# Patient Record
Sex: Female | Born: 1995 | Race: White | Hispanic: No | Marital: Single | Smoking: Current every day smoker
Health system: Southern US, Community
[De-identification: ages and names within clinical notes are randomized; demographics above are authoritative.]

## PROBLEM LIST (undated history)

## (undated) DIAGNOSIS — I10 Essential (primary) hypertension: Secondary | ICD-10-CM

---
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
## 2015-01-13 DIAGNOSIS — K069 Disorder of gingiva and edentulous alveolar ridge, unspecified: Secondary | ICD-10-CM | POA: Insufficient documentation

## 2015-12-07 DIAGNOSIS — K0889 Other specified disorders of teeth and supporting structures: Secondary | ICD-10-CM | POA: Insufficient documentation

## 2017-01-04 ENCOUNTER — Other Ambulatory Visit: Payer: Self-pay

## 2017-01-04 ENCOUNTER — Emergency Department
Admission: EM | Admit: 2017-01-04 | Discharge: 2017-01-04 | Disposition: A | Discharge status: Home / Self Care | Payer: Self-pay | Attending: Student in an Organized Health Care Education/Training Program | Admitting: Student in an Organized Health Care Education/Training Program

## 2017-01-04 ENCOUNTER — Encounter: Payer: Self-pay | Admitting: Emergency Medicine

## 2017-01-04 DIAGNOSIS — N939 Abnormal uterine and vaginal bleeding, unspecified: Secondary | ICD-10-CM | POA: Insufficient documentation

## 2017-01-04 DIAGNOSIS — F17210 Nicotine dependence, cigarettes, uncomplicated: Secondary | ICD-10-CM | POA: Insufficient documentation

## 2017-01-04 LAB — CBC
HCT: 43.4 % (ref 35.0–47.0)
Hemoglobin: 14.9 g/dL (ref 12.0–16.0)
MCH: 31.6 pg (ref 26.0–34.0)
MCHC: 34.4 g/dL (ref 32.0–36.0)
MCV: 92 fL (ref 80.0–100.0)
PLATELETS: 219 10*3/uL (ref 150–440)
RBC: 4.72 MIL/uL (ref 3.80–5.20)
RDW: 13.1 % (ref 11.5–14.5)
WBC: 11.8 10*3/uL — AB (ref 3.6–11.0)

## 2017-01-04 LAB — URINALYSIS, COMPLETE (UACMP) WITH MICROSCOPIC
BILIRUBIN URINE: NEGATIVE
GLUCOSE, UA: NEGATIVE mg/dL
Ketones, ur: NEGATIVE mg/dL
LEUKOCYTES UA: NEGATIVE
Nitrite: NEGATIVE
PH: 6 (ref 5.0–8.0)
Protein, ur: 30 mg/dL — AB
SPECIFIC GRAVITY, URINE: 1.017 (ref 1.005–1.030)

## 2017-01-04 LAB — POC URINE PREG, ED: Preg Test, Ur: NEGATIVE

## 2017-01-04 LAB — BASIC METABOLIC PANEL
Anion gap: 8 (ref 5–15)
BUN: 7 mg/dL (ref 6–20)
CHLORIDE: 103 mmol/L (ref 101–111)
CO2: 28 mmol/L (ref 22–32)
CREATININE: 0.63 mg/dL (ref 0.44–1.00)
Calcium: 9.2 mg/dL (ref 8.9–10.3)
GFR calc non Af Amer: >60 mL/min (ref >60)
Glucose, Bld: 75 mg/dL (ref 65–99)
POTASSIUM: 3 mmol/L — AB (ref 3.5–5.1)
SODIUM: 139 mmol/L (ref 135–145)

## 2017-01-04 NOTE — ED Provider Notes (Signed)
[facility] Emergency Department Provider Note    First MD Initiated Contact with Patient 01/04/17 2025     (approximate)  I have reviewed the triage vital signs and the nursing notes.   HISTORY  Chief Complaint Vaginal Bleeding    HPI Robin Carroll is a 21 y.o. female with no past history of bleeding disorders or recent hospitalizations presents 2 weeks after her last menstrual cycle with persistent vaginal bleeding.  States that she is gone through roughly 10 pads in the past 24 hours.  States that she has noted a few clots.  Was concerned that she is pregnant.  Denies any abdominal pain at this time.  States that one week ago she was having diffuse cramping sensations but those have significantly improved.  Denies any lightheadedness or fainting spells.  No chest pain or shortness of breath.  Is not on any birth control.  [place]  Is not on any blood thinners.  She does smoke roughly 1 pack of cigarettes per day.  History reviewed. No pertinent past medical history. History reviewed. No pertinent family history. History reviewed. No pertinent surgical history. There are no active problems to display for this patient.     Prior to Admission medications   Not on File    Allergies Penicillins    Social History Social History   Tobacco Use  . Smoking status: Current Every Day Smoker    Packs/day: 0.50    Types: Cigarettes  . Smokeless tobacco: Never Used  Substance Use Topics  . Alcohol use: No    Frequency: Never  . Drug use: No    Review of Systems Patient denies headaches, rhinorrhea, blurry vision, numbness, shortness of breath, chest pain, edema, cough, abdominal pain, nausea, vomiting, diarrhea, dysuria, fevers, rashes or hallucinations unless otherwise stated above in HPI. ____________________________________________   PHYSICAL EXAM:  VITAL SIGNS: Vitals:   01/04/17 1938  BP: (!) 143/72    Pulse: 78  Resp: 16  Temp: 98.8 F (37.1 C)  SpO2: 100%    Constitutional: Alert and oriented. Well appearing and in no acute distress. Eyes: Conjunctivae are normal.  Head: Atraumatic. Nose: No congestion/rhinnorhea. Mouth/Throat: Mucous membranes are moist.   Neck: No stridor. Painless ROM.  Cardiovascular: Normal rate, regular rhythm. Grossly normal heart sounds.  Good peripheral circulation. Respiratory: Normal respiratory effort.  No retractions. Lungs CTAB. Gastrointestinal: Soft and nontender. No distention. No abdominal bruits. No CVA tenderness. Genitourinary: deferred Musculoskeletal: No lower extremity tenderness nor edema.  No joint effusions. Neurologic:  Normal speech and language. No gross focal neurologic deficits are appreciated. No facial droop Skin:  Skin is warm, dry and intact. No rash noted. Psychiatric: Mood and affect are normal. Speech and behavior are normal.  ____________________________________________   LABS (all labs ordered are listed, but only abnormal results are displayed)  Results for orders placed or performed during the hospital encounter of 01/04/17 (from the past 24 hour(s))  POC urine preg, ED     Status: None   Collection Time: 01/04/17  7:51 PM  Result Value Ref Range   Preg Test, Ur Negative Negative  Basic metabolic panel     Status: Abnormal   Collection Time: 01/04/17  7:52 PM  Result Value Ref Range   Sodium 139 135 - 145 mmol/L   Potassium 3.0 (L) 3.5 - 5.1 mmol/L   Chloride 103 101 - 111 mmol/L   CO2 28 22 - 32 mmol/L   Glucose,  Bld 75 65 - 99 mg/dL   BUN 7 6 - 20 mg/dL   Creatinine, Ser 1.610.63 0.44 - 1.00 mg/dL   Calcium 9.2 8.9 - 09.610.3 mg/dL   GFR calc non Af Amer >60 >60 mL/min   GFR calc Af Amer >60 >60 mL/min   Anion gap 8 5 - 15  CBC     Status: Abnormal   Collection Time: 01/04/17  7:52 PM  Result Value Ref Range   WBC 11.8 (H) 3.6 - 11.0 K/uL   RBC 4.72 3.80 - 5.20 MIL/uL   Hemoglobin 14.9 12.0 - 16.0 g/dL    HCT 04.543.4 40.935.0 - 81.147.0 %   MCV 92.0 80.0 - 100.0 fL   MCH 31.6 26.0 - 34.0 pg   MCHC 34.4 32.0 - 36.0 g/dL   RDW 91.413.1 78.211.5 - 95.614.5 %   Platelets 219 150 - 440 K/uL  Urinalysis, Complete w Microscopic     Status: Abnormal   Collection Time: 01/04/17  7:52 PM  Result Value Ref Range   Color, Urine YELLOW (A) YELLOW   APPearance CLEAR (A) CLEAR   Specific Gravity, Urine 1.017 1.005 - 1.030   pH 6.0 5.0 - 8.0   Glucose, UA NEGATIVE NEGATIVE mg/dL   Hgb urine dipstick LARGE (A) NEGATIVE   Bilirubin Urine NEGATIVE NEGATIVE   Ketones, ur NEGATIVE NEGATIVE mg/dL   Protein, ur 30 (A) NEGATIVE mg/dL   Nitrite NEGATIVE NEGATIVE   Leukocytes, UA NEGATIVE NEGATIVE   RBC / HPF TOO NUMEROUS TO COUNT 0 - 5 RBC/hpf   WBC, UA 0-5 0 - 5 WBC/hpf   Bacteria, UA RARE (A) NONE SEEN   Squamous Epithelial / LPF 0-5 (A) NONE SEEN   Mucus PRESENT    Ca Oxalate Crys, UA PRESENT    ____________________________________________ ____________________________________________   PROCEDURES  Procedure(s) performed:  Procedures    Critical Care performed: no ____________________________________________   INITIAL IMPRESSION / ASSESSMENT AND PLAN / ED COURSE  Pertinent labs & imaging results that were available during my care of the patient were reviewed by me and considered in my medical decision making (see chart for details).  DDX: Anovulatory cycle, ectopic, pregnancy, blood dyscrasia, dysfunctional uterine bleeding  Robin CellarBrandy R Schwimmer is a 21 y.o. who presents to the ED with vaginal bleeding as described above.  Patient well-appearing and in no acute distress.  Patient is not pregnant.  Abdominal exam is soft and benign.  Based on her heavy smoking history do not feel that she is appropriate candidate for oral contraceptive pills.  Patient is also at higher risk for TXA.  Have offered TXA as well as ultrasound evaluation the patient but she is otherwise well-appearing.  States that she would prefer to trial a  period of Motrin and follow-up with OB/GYN.  Discussed signs and symptoms for which she should return immediately to the hospital.      ____________________________________________   FINAL CLINICAL IMPRESSION(S) / ED DIAGNOSES  Final diagnoses:  Vaginal bleeding      NEW MEDICATIONS STARTED DURING THIS VISIT:  This SmartLink is deprecated. Use AVSMEDLIST instead to display the medication list for a patient.   Note:  This document was prepared using Dragon voice recognition software and may include unintentional dictation errors.    Willy Eddyobinson, Kelyn Ponciano, MD 01/04/17 2055

## 2017-01-04 NOTE — ED Triage Notes (Signed)
Pt to ED c/o vaginal bleeding x3 days with some generalized abd cramping.  States nausea but no vomiting or diarrhea.  Reports last menstrual cycle 2 weeks ago.  Possibly pregnant.  Reports several pads worth a day.

## 2017-08-03 ENCOUNTER — Encounter: Payer: Self-pay | Admitting: Emergency Medicine

## 2017-08-03 ENCOUNTER — Emergency Department
Admission: EM | Admit: 2017-08-03 | Discharge: 2017-08-03 | Disposition: A | Discharge status: Home / Self Care | Payer: Self-pay | Attending: Emergency Medicine | Admitting: Emergency Medicine

## 2017-08-03 ENCOUNTER — Emergency Department: Payer: Self-pay

## 2017-08-03 ENCOUNTER — Other Ambulatory Visit: Payer: Self-pay

## 2017-08-03 DIAGNOSIS — I10 Essential (primary) hypertension: Secondary | ICD-10-CM | POA: Insufficient documentation

## 2017-08-03 DIAGNOSIS — K2900 Acute gastritis without bleeding: Secondary | ICD-10-CM | POA: Insufficient documentation

## 2017-08-03 DIAGNOSIS — F17210 Nicotine dependence, cigarettes, uncomplicated: Secondary | ICD-10-CM | POA: Insufficient documentation

## 2017-08-03 DIAGNOSIS — R109 Unspecified abdominal pain: Secondary | ICD-10-CM | POA: Insufficient documentation

## 2017-08-03 HISTORY — DX: Essential (primary) hypertension: I10

## 2017-08-03 LAB — COMPREHENSIVE METABOLIC PANEL
ALK PHOS: 60 U/L (ref 38–126)
ALT: 14 U/L (ref 0–44)
AST: 16 U/L (ref 15–41)
Albumin: 4.4 g/dL (ref 3.5–5.0)
Anion gap: 6 (ref 5–15)
BILIRUBIN TOTAL: 0.5 mg/dL (ref 0.3–1.2)
BUN: 7 mg/dL (ref 6–20)
CALCIUM: 8.9 mg/dL (ref 8.9–10.3)
CHLORIDE: 103 mmol/L (ref 98–111)
CO2: 31 mmol/L (ref 22–32)
CREATININE: 0.58 mg/dL (ref 0.44–1.00)
Glucose, Bld: 97 mg/dL (ref 70–99)
Potassium: 3.9 mmol/L (ref 3.5–5.1)
Sodium: 140 mmol/L (ref 135–145)
TOTAL PROTEIN: 7.6 g/dL (ref 6.5–8.1)

## 2017-08-03 LAB — URINALYSIS, COMPLETE (UACMP) WITH MICROSCOPIC
Glucose, UA: NEGATIVE mg/dL
Hgb urine dipstick: NEGATIVE
KETONES UR: 5 mg/dL — AB
LEUKOCYTES UA: NEGATIVE
NITRITE: NEGATIVE
Protein, ur: 30 mg/dL — AB
SPECIFIC GRAVITY, URINE: 1.032 — AB (ref 1.005–1.030)
pH: 5 (ref 5.0–8.0)

## 2017-08-03 LAB — CBC
HCT: 45.3 % (ref 35.0–47.0)
Hemoglobin: 15.6 g/dL (ref 12.0–16.0)
MCH: 31.4 pg (ref 26.0–34.0)
MCHC: 34.4 g/dL (ref 32.0–36.0)
MCV: 91.3 fL (ref 80.0–100.0)
PLATELETS: 247 10*3/uL (ref 150–440)
RBC: 4.96 MIL/uL (ref 3.80–5.20)
RDW: 13.6 % (ref 11.5–14.5)
WBC: 7.8 10*3/uL (ref 3.6–11.0)

## 2017-08-03 LAB — LIPASE, BLOOD: LIPASE: 30 U/L (ref 11–51)

## 2017-08-03 LAB — POCT PREGNANCY, URINE: PREG TEST UR: NEGATIVE

## 2017-08-03 MED ORDER — PANTOPRAZOLE SODIUM 40 MG PO TBEC: 40.0000 mg | DELAYED_RELEASE_TABLET | Freq: Every day | ORAL | 1 refills | Status: DC

## 2017-08-03 MED ORDER — LORAZEPAM 2 MG/ML IJ SOLN
1.0000 mg | Freq: Once | INTRAMUSCULAR | Status: AC
  Administered 2017-08-03: 1 mg via INTRAVENOUS
  Filled 2017-08-03: qty 1

## 2017-08-03 MED ORDER — ONDANSETRON 4 MG PO TBDP
4.0000 mg | ORAL_TABLET | Freq: Once | ORAL | Status: AC | PRN
  Administered 2017-08-03: 4 mg via ORAL
  Filled 2017-08-03: qty 1

## 2017-08-03 MED ORDER — ONDANSETRON 4 MG PO TBDP: 4.0000 mg | ORAL_TABLET | Freq: Three times a day (TID) | ORAL | 0 refills | Status: DC | PRN

## 2017-08-03 MED ORDER — SODIUM CHLORIDE 0.9 % IV SOLN
1000.0000 mL | Freq: Once | INTRAVENOUS | Status: AC
  Administered 2017-08-03: 1000 mL via INTRAVENOUS

## 2017-08-03 MED ORDER — ONDANSETRON HCL 4 MG/2ML IJ SOLN
4.0000 mg | Freq: Once | INTRAMUSCULAR | Status: AC
  Administered 2017-08-03: 4 mg via INTRAVENOUS
  Filled 2017-08-03: qty 2

## 2017-08-03 MED ORDER — MORPHINE SULFATE (PF) 4 MG/ML IV SOLN
4.0000 mg | Freq: Once | INTRAVENOUS | Status: AC
Start: 2017-08-03 — End: 2017-08-03
  Administered 2017-08-03: 4 mg via INTRAVENOUS
  Filled 2017-08-03: qty 1

## 2017-08-03 MED ORDER — HALOPERIDOL LACTATE 5 MG/ML IJ SOLN
2.0000 mg | Freq: Once | INTRAMUSCULAR | Status: AC
  Administered 2017-08-03: 2 mg via INTRAVENOUS
  Filled 2017-08-03: qty 1

## 2017-08-03 MED ORDER — FAMOTIDINE IN NACL 20-0.9 MG/50ML-% IV SOLN
20.0000 mg | Freq: Two times a day (BID) | INTRAVENOUS | Status: DC
  Administered 2017-08-03: 20 mg via INTRAVENOUS
  Filled 2017-08-03: qty 50

## 2017-08-03 NOTE — ED Notes (Signed)
Upon entering room, pt was asleep and resting. This nurse awoke the pt to ask about giving pain medication. Pt asked to still receive the pain medication and was feeling better.

## 2017-08-03 NOTE — ED Provider Notes (Signed)
-----------------------------------------   5:53 PM on 08/03/2017 -----------------------------------------  Patient care assumed from Dr. Mayford KnifeWilliams.  Patient states her pain is nearly gone at this time.  She is awake, able to converse.  Is asking to be discharged home.  Patient's labs are normal, highly suspect gastritis/gastroenteritis.  We will discharge with Protonix and Zofran.  We will have the patient follow-up with GI medicine.  Patient agreeable to plan of care.   Minna AntisPaduchowski, Jolon Degante, MD 08/03/17 1754

## 2017-08-03 NOTE — ED Notes (Signed)
Yelling coming from pt room. Mother, husband and another man are in the room and have pt upset. Pt says they are towing her car. Mother states that the staff are "assholes" and that they are going to "ruin her life if they tow her car". Unable to calm all 4 people down. Sent family out of room and to the lobby for disruption and had an officer speak with pt who is calm and reasonable. Pt informed family was fighting in the parking lot and drew attention to the fact that the tags on her car do not belong on that car and that the police took the tags due to that. Pt is angry with family but is calm and alert and oriented. Pt has a steady gait. Family waiting in lobby.

## 2017-08-03 NOTE — ED Notes (Signed)
Patient is resting comfortably. 

## 2017-08-03 NOTE — ED Triage Notes (Signed)
Pt comes into the ED via POV c/o right side abdominal pain and hemoptysis.  Patient states this has been ongoing for a couple of days.  There are streaks of bright blood in the emesis, but not extensive bleeding.  Patient states she has vomited over 10x today.  Patient denies any diarrhea or fevers.  Patient has even and unlabored respirations at this time.

## 2017-08-03 NOTE — ED Provider Notes (Signed)
[facility] Emergency Department Provider Note       Time seen: ----------------------------------------- 2:39 PM on 08/03/2017 -----------------------------------------   I have reviewed the triage vital signs and the nursing notes.  HISTORY   Chief Complaint Hematemesis and Abdominal Pain    HPI Robin Carroll is a 22 y.o. female with a history of hypertension who presents to the ED for abdominal pain and hemoptysis.  Patient states this is been going on for a couple of days.  Patient denies a history of this in the past, nothing makes it better.  She has had bright red blood in her vomit.  She is vomited over 10 times a day but has not had any diarrhea today, no fever.  She arrives hyperventilating and screaming in pain.  Past Medical History:  Diagnosis Date  . Hypertension     There are no active problems to display for this patient.   History reviewed. No pertinent surgical history.  Allergies Penicillins  Social History Social History   Tobacco Use  . Smoking status: Current Every Day Smoker    Packs/day: 0.50    Types: Cigarettes  . Smokeless tobacco: Never Used  Substance Use Topics  . Alcohol use: No    Frequency: Never  . Drug use: No   Review of Systems Constitutional: Negative for fever. Cardiovascular: Negative for chest pain. Respiratory: Negative for shortness of breath. Gastrointestinal: Positive for abdominal pain and vomiting Musculoskeletal: Negative for back pain. Skin: Negative for rash. Neurological: Negative for headaches, focal weakness or numbness.  All systems negative/normal/unremarkable except as stated in the HPI  ____________________________________________   PHYSICAL EXAM:  VITAL SIGNS: ED Triage Vitals  Enc Vitals Group     BP --      Pulse --      Resp --      Temp --      Temp src --      SpO2 --      Weight 08/03/17 1409 145 lb (65.8 kg)     Height 08/03/17 1409 5\' 4"  (1.626 m)   Head Circumference --      Peak Flow --      Pain Score 08/03/17 1408 10     Pain Loc --      Pain Edu? --      Excl. in GC? --    Constitutional: Anxious, moderate distress Eyes: Conjunctivae are normal. Normal extraocular movements. ENT   Head: Normocephalic and atraumatic.   Nose: No congestion/rhinnorhea.   Mouth/Throat: Mucous membranes are moist.   Neck: No stridor. Cardiovascular: Normal rate, regular rhythm. No murmurs, rubs, or gallops. Respiratory: Normal respiratory effort without tachypnea nor retractions. Breath sounds are clear and equal bilaterally. No wheezes/rales/rhonchi. Gastrointestinal: Diffuse lower abdominal tenderness, no rebound or guarding.  Normal bowel sounds Musculoskeletal: Nontender with normal range of motion in extremities. No lower extremity tenderness nor edema. Neurologic:  Normal speech and language. No gross focal neurologic deficits are appreciated.  Skin:  Skin is warm, dry and intact. No rash noted. Psychiatric: Anxious and hyperventilating ____________________________________________  ED COURSE:  As part of my medical decision making, I reviewed the following data within the electronic MEDICAL RECORD NUMBER History obtained from family if available, nursing notes, old chart and ekg, as well as notes from prior ED visits. Patient presented for abdominal pain and vomiting with hematemesis, we will assess with labs and imaging as indicated at this time.   Procedures ____________________________________________   LABS (pertinent positives/negatives)  Labs Reviewed  LIPASE, BLOOD  COMPREHENSIVE METABOLIC PANEL  CBC  URINALYSIS, COMPLETE (UACMP) WITH MICROSCOPIC  POC URINE PREG, ED    RADIOLOGY Images were viewed by me  Abdomen 2 view Is pending at this time ____________________________________________  DIFFERENTIAL DIAGNOSIS   Anxiety, gastroenteritis, gastritis, pancreatitis, peptic ulcer disease, GERD  FINAL ASSESSMENT  AND PLAN  Vomiting   Plan: The patient had presented for vomiting and abdominal pain. Patient's labs thus far have been unremarkable. Patient's imaging is still pending.  Patient was severely anxious and screaming on arrival.  We did have to give her Haldol as well as Ativan and some morphine to try to control her symptoms.  She is currently resting comfortably in bed.  Final disposition is pending at this time.   Ulice DashJohnathan E Shanda Cadotte, MD   Note: This note was generated in part or whole with voice recognition software. Voice recognition is usually quite accurate but there are transcription errors that can and very often do occur. I apologize for any typographical errors that were not detected and corrected.     Emily FilbertWilliams, Early Ord E, MD 08/03/17 1539

## 2017-11-07 ENCOUNTER — Emergency Department: Payer: Self-pay

## 2017-11-07 ENCOUNTER — Encounter: Payer: Self-pay | Admitting: *Deleted

## 2017-11-07 ENCOUNTER — Other Ambulatory Visit: Payer: Self-pay

## 2017-11-07 ENCOUNTER — Emergency Department
Admission: EM | Admit: 2017-11-07 | Discharge: 2017-11-07 | Disposition: A | Discharge status: Home / Self Care | Payer: Self-pay | Attending: Emergency Medicine | Admitting: Emergency Medicine

## 2017-11-07 DIAGNOSIS — Y939 Activity, unspecified: Secondary | ICD-10-CM | POA: Insufficient documentation

## 2017-11-07 DIAGNOSIS — X509XXA Other and unspecified overexertion or strenuous movements or postures, initial encounter: Secondary | ICD-10-CM | POA: Insufficient documentation

## 2017-11-07 DIAGNOSIS — Y929 Unspecified place or not applicable: Secondary | ICD-10-CM | POA: Insufficient documentation

## 2017-11-07 DIAGNOSIS — Y999 Unspecified external cause status: Secondary | ICD-10-CM | POA: Insufficient documentation

## 2017-11-07 DIAGNOSIS — F17210 Nicotine dependence, cigarettes, uncomplicated: Secondary | ICD-10-CM | POA: Insufficient documentation

## 2017-11-07 DIAGNOSIS — I10 Essential (primary) hypertension: Secondary | ICD-10-CM | POA: Insufficient documentation

## 2017-11-07 DIAGNOSIS — Z79899 Other long term (current) drug therapy: Secondary | ICD-10-CM | POA: Insufficient documentation

## 2017-11-07 DIAGNOSIS — S93402A Sprain of unspecified ligament of left ankle, initial encounter: Secondary | ICD-10-CM | POA: Insufficient documentation

## 2017-11-07 LAB — POCT PREGNANCY, URINE: Preg Test, Ur: NEGATIVE

## 2017-11-07 MED ORDER — MELOXICAM 15 MG PO TABS: 15.0000 mg | ORAL_TABLET | Freq: Every day | ORAL | 1 refills | Status: AC

## 2017-11-07 NOTE — ED Provider Notes (Signed)
[facility] Emergency Department Provider Note  ____________________________________________  Time seen: Approximately 10:24 PM  I have reviewed the triage vital signs and the nursing notes.   HISTORY  Chief Complaint Ankle Pain    HPI Robin Carroll is a 22 y.o. female presents to the emergency department with left lateral and medial ankle pain after patient sustained an inversion type ankle injury 2 days ago.  Patient has her experience increased pain with ambulation and relief with rest.  No prior ankle sprains in the past.  Patient has had some tingling in her toes but no loss of sensation.  Patient did not hit her head during the fall.  No alleviating measures of been attempted.   Past Medical History:  Diagnosis Date  . Hypertension     There are no active problems to display for this patient.   No past surgical history on file.  Prior to Admission medications   Medication Sig Start Date End Date Taking? Authorizing Provider  meloxicam (MOBIC) 15 MG tablet Take 1 tablet (15 mg total) by mouth daily for 7 days. 11/07/17 11/14/17  Pia Mau M, PA-C  ondansetron (ZOFRAN ODT) 4 MG disintegrating tablet Take 1 tablet (4 mg total) by mouth every 8 (eight) hours as needed for nausea or vomiting. 08/03/17   Minna Antis, MD  pantoprazole (PROTONIX) 40 MG tablet Take 1 tablet (40 mg total) by mouth daily. 08/03/17 08/03/18  Minna Antis, MD    Allergies Penicillins  No family history on file.  Social History Social History   Tobacco Use  . Smoking status: Current Every Day Smoker    Packs/day: 0.50    Types: Cigarettes  . Smokeless tobacco: Never Used  Substance Use Topics  . Alcohol use: No    Frequency: Never  . Drug use: No     Review of Systems  Constitutional: No fever/chills Eyes: No visual changes. No discharge ENT: No upper respiratory complaints. Cardiovascular: no chest pain. Respiratory: no cough. No  SOB. Gastrointestinal: No abdominal pain.  No nausea, no vomiting.  No diarrhea.  No constipation. Genitourinary: Negative for dysuria. No hematuria Musculoskeletal: Patient has left ankle pain. Skin: Negative for rash, abrasions, lacerations, ecchymosis. Neurological: Negative for headaches, focal weakness or numbness.   ____________________________________________   PHYSICAL EXAM:  VITAL SIGNS: ED Triage Vitals  Enc Vitals Group     BP 11/07/17 2013 (!) 141/69     Pulse Rate 11/07/17 2013 73     Resp 11/07/17 2013 18     Temp 11/07/17 2013 98.6 F (37 C)     Temp Source 11/07/17 2013 Oral     SpO2 11/07/17 2013 99 %     Weight 11/07/17 2014 147 lb (66.7 kg)     Height 11/07/17 2014 5\' 3"  (1.6 m)     Head Circumference --      Peak Flow --      Pain Score 11/07/17 2014 6     Pain Loc --      Pain Edu? --      Excl. in GC? --      Constitutional: Alert and oriented. Well appearing and in no acute distress. Eyes: Conjunctivae are normal. PERRL. EOMI. Head: Atraumatic. Cardiovascular: Normal rate, regular rhythm. Normal S1 and S2.  Good peripheral circulation. Respiratory: Normal respiratory effort without tachypnea or retractions. Lungs CTAB. Good air entry to the bases with no decreased or absent breath sounds. Gastrointestinal: Bowel sounds 4 quadrants. Soft and nontender to palpation.  No guarding or rigidity. No palpable masses. No distention. No CVA tenderness. Musculoskeletal: Patient is able to perform limited range of motion at the left ankle, likely secondary to pain.  Patient has pain over the left anterior and posterior talofibular ligaments as well as the deltoid ligament.  No pain over the course of the left fibula.  No pain with palpation over the course of the left metatarsals.  Palpable dorsalis pedis pulse, left. Neurologic:  Normal speech and language. No gross focal neurologic deficits are appreciated.  Skin:  Skin is warm, dry and intact. No rash  noted. Psychiatric: Mood and affect are normal. Speech and behavior are normal. Patient exhibits appropriate insight and judgement.   ____________________________________________   LABS (all labs ordered are listed, but only abnormal results are displayed)  Labs Reviewed  POC URINE PREG, ED  POCT PREGNANCY, URINE   ____________________________________________  EKG   ____________________________________________  RADIOLOGY I personally viewed and evaluated these images as part of my medical decision making, as well as reviewing the written report by the radiologist  Dg Foot Complete Left  Result Date: 11/07/2017 CLINICAL DATA:  Pain following fall EXAM: LEFT FOOT - COMPLETE 3+ VIEW COMPARISON:  None. FINDINGS: Frontal, oblique, and lateral views obtained. There is no fracture or dislocation. Joint spaces appear normal. No erosive change. IMPRESSION: No fracture or dislocation.  No evident arthropathy. Electronically Signed   By: Bretta Bang III M.D.   On: 11/07/2017 21:20    ____________________________________________    PROCEDURES  Procedure(s) performed:    Procedures    Medications - No data to display   ____________________________________________   INITIAL IMPRESSION / ASSESSMENT AND PLAN / ED COURSE  Pertinent labs & imaging results that were available during my care of the patient were reviewed by me and considered in my medical decision making (see chart for details).  Review of the [place] CSRS was performed in accordance of the [facility] prior to dispensing any controlled drugs.      Assessment and plan Left ankle pain Patient presents to the emergency department with left ankle pain after sustaining an inversion type ankle injury.  X-ray examination of the left ankle revealed no acute fractures or bony abnormalities.  An Ace wrap was applied in the emergency department and patient was discharged with meloxicam.  Patient was advised to follow-up with  podiatry if conservative treatment fails to improve symptoms or if symptoms worsen.  Patient voiced understanding.  All patient questions were answered.    ____________________________________________  FINAL CLINICAL IMPRESSION(S) / ED DIAGNOSES  Final diagnoses:  Sprain of left ankle, unspecified ligament, initial encounter      NEW MEDICATIONS STARTED DURING THIS VISIT:  ED Discharge Orders         Ordered    meloxicam (MOBIC) 15 MG tablet  Daily     11/07/17 2221              This chart was dictated using voice recognition software/Dragon. Despite best efforts to proofread, errors can occur which can change the meaning. Any change was purely unintentional.    Robin Carroll 11/07/17 2227    Sharman Cheek, MD 11/07/17 2316

## 2017-11-07 NOTE — ED Notes (Signed)
Pt states that she missed a step and injured her left ankle

## 2017-11-07 NOTE — ED Triage Notes (Signed)
Pt fell 2 days ago. Pt has swelling and pain to left ankle.  Pt alert.

## 2018-06-27 ENCOUNTER — Other Ambulatory Visit: Payer: Self-pay

## 2018-06-27 ENCOUNTER — Encounter: Payer: Self-pay | Admitting: *Deleted

## 2018-06-27 ENCOUNTER — Emergency Department
Admission: EM | Admit: 2018-06-27 | Discharge: 2018-06-27 | Disposition: A | Discharge status: Home / Self Care | Payer: Self-pay | Attending: Emergency Medicine | Admitting: Emergency Medicine

## 2018-06-27 DIAGNOSIS — K0510 Chronic gingivitis, plaque induced: Secondary | ICD-10-CM | POA: Insufficient documentation

## 2018-06-27 DIAGNOSIS — I10 Essential (primary) hypertension: Secondary | ICD-10-CM | POA: Insufficient documentation

## 2018-06-27 DIAGNOSIS — Z79899 Other long term (current) drug therapy: Secondary | ICD-10-CM | POA: Insufficient documentation

## 2018-06-27 DIAGNOSIS — F17210 Nicotine dependence, cigarettes, uncomplicated: Secondary | ICD-10-CM | POA: Insufficient documentation

## 2018-06-27 MED ORDER — CLINDAMYCIN HCL 300 MG PO CAPS: 300.0000 mg | ORAL_CAPSULE | Freq: Four times a day (QID) | ORAL | 0 refills | Status: DC

## 2018-06-27 MED ORDER — HYDROCODONE-ACETAMINOPHEN 5-325 MG PO TABS
1.0000 | ORAL_TABLET | Freq: Once | ORAL | Status: AC
  Administered 2018-06-27: 20:00:00 1 via ORAL
  Filled 2018-06-27: qty 1

## 2018-06-27 MED ORDER — MAGIC MOUTHWASH W/LIDOCAINE: 5.0000 mL | Freq: Four times a day (QID) | ORAL | 0 refills | Status: DC

## 2018-06-27 MED ORDER — CLINDAMYCIN HCL 150 MG PO CAPS
300.0000 mg | ORAL_CAPSULE | Freq: Once | ORAL | Status: AC
  Administered 2018-06-27: 300 mg via ORAL
  Filled 2018-06-27: qty 2

## 2018-06-27 MED ORDER — CHLORHEXIDINE GLUCONATE 0.12 % MT SOLN: 10.0000 mL | Freq: Two times a day (BID) | OROMUCOSAL | 0 refills | Status: DC

## 2018-06-27 NOTE — ED Provider Notes (Signed)
[facility] Emergency Department Provider Note  ____________________________________________  Time seen: Approximately 7:50 PM  I have reviewed the triage vital signs and the nursing notes.   HISTORY  Chief Complaint Oral Swelling    HPI Robin Carroll is a 23 y.o. female who presents the emergency department complaining of left upper dental pain.  Patient reports that she had a dental abscess 2 weeks ago that was treated by her dentist with antibiotics.  Patient reports that this area seem to improve however she is developed "gum" pain forward of her tooth.  Patient denies any sore throat, difficulty breathing or swallowing.  Patient denies any gross edema of the area.  She admits to overall poor dentition.  Patient has been using Motrin for the pain.  No other medications.        Past Medical History:  Diagnosis Date  . Hypertension     There are no active problems to display for this patient.   No past surgical history on file.  Prior to Admission medications   Medication Sig Start Date End Date Taking? Authorizing Provider  chlorhexidine (PERIDEX) 0.12 % solution Use as directed 10 mLs in the mouth or throat 2 (two) times daily. Swish and spit 06/27/18   Cuthriell, Delorise Royals, PA-C  clindamycin (CLEOCIN) 300 MG capsule Take 1 capsule (300 mg total) by mouth 4 (four) times daily. 06/27/18   Cuthriell, Delorise Royals, PA-C  magic mouthwash w/lidocaine SOLN Take 5 mLs by mouth 4 (four) times daily. 06/27/18   Cuthriell, Delorise Royals, PA-C  ondansetron (ZOFRAN ODT) 4 MG disintegrating tablet Take 1 tablet (4 mg total) by mouth every 8 (eight) hours as needed for nausea or vomiting. 08/03/17   Minna Antis, MD  pantoprazole (PROTONIX) 40 MG tablet Take 1 tablet (40 mg total) by mouth daily. 08/03/17 08/03/18  Minna Antis, MD    Allergies Penicillins  No family history on file.  Social History Social History   Tobacco Use  . Smoking status: Current  Every Day Smoker    Packs/day: 0.50    Types: Cigarettes  . Smokeless tobacco: Never Used  Substance Use Topics  . Alcohol use: No    Frequency: Never  . Drug use: No     Review of Systems  Constitutional: No fever/chills Eyes: No visual changes. No discharge ENT: Left upper dental/gum pain Cardiovascular: no chest pain. Respiratory: no cough. No SOB. Gastrointestinal: No abdominal pain.  No nausea, no vomiting.  No diarrhea.  No constipation. Musculoskeletal: Negative for musculoskeletal pain. Skin: Negative for rash, abrasions, lacerations, ecchymosis. Neurological: Negative for headaches, focal weakness or numbness. 10-point ROS otherwise negative.  ____________________________________________   PHYSICAL EXAM:  VITAL SIGNS: ED Triage Vitals  Enc Vitals Group     BP 06/27/18 1859 (!) 134/93     Pulse Rate 06/27/18 1859 68     Resp 06/27/18 1859 18     Temp 06/27/18 1859 98.6 F (37 C)     Temp Source 06/27/18 1859 Oral     SpO2 06/27/18 1859 98 %     Weight 06/27/18 1857 147 lb (66.7 kg)     Height 06/27/18 1857  (1.6 m)     Head Circumference --      Peak Flow --      Pain Score 06/27/18 1857 9     Pain Loc --      Pain Edu? --      Excl. in GC? --  Constitutional: Alert and oriented. Well appearing and in no acute distress. Eyes: Conjunctivae are normal. PERRL. EOMI. Head: Atraumatic. ENT:      Ears:       Nose: No congestion/rhinnorhea.      Mouth/Throat: Mucous membranes are moist.  Visualization of the dentition reveals poor dentition throughout.  Area of concern with multiple caries but no gross fractures.  Patient does have findings consistent with gingivitis along the left upper gumline.  No appreciable abscess requiring incision and drainage.  Uvula is midline.  Oropharynx is nonerythematous and nonedematous. Neck: No stridor.  Hematological/Lymphatic/Immunilogical: No cervical lymphadenopathy. Cardiovascular: Normal rate, regular rhythm.  Normal S1 and S2.  Good peripheral circulation. Respiratory: Normal respiratory effort without tachypnea or retractions. Lungs CTAB. Good air entry to the bases with no decreased or absent breath sounds. Musculoskeletal: Full range of motion to all extremities. No gross deformities appreciated. Neurologic:  Normal speech and language. No gross focal neurologic deficits are appreciated.  Skin:  Skin is warm, dry and intact. No rash noted. Psychiatric: Mood and affect are normal. Speech and behavior are normal. Patient exhibits appropriate insight and judgement.   ____________________________________________   LABS (all labs ordered are listed, but only abnormal results are displayed)  Labs Reviewed - No data to display ____________________________________________  EKG   ____________________________________________  RADIOLOGY   No results found.  ____________________________________________    PROCEDURES  Procedure(s) performed:    Procedures    Medications  clindamycin (CLEOCIN) capsule 300 mg (has no administration in time range)  HYDROcodone-acetaminophen (NORCO/VICODIN) 5-325 MG per tablet 1 tablet (has no administration in time range)     ____________________________________________   INITIAL IMPRESSION / ASSESSMENT AND PLAN / ED COURSE  Pertinent labs & imaging results that were available during my care of the patient were reviewed by me and considered in my medical decision making (see chart for details).  Review of the [place] CSRS was performed in accordance of the [facility] prior to dispensing any controlled drugs.           Patient's diagnosis is consistent with gingivitis.  Patient presented to the emergency department complaining of gum pain to the left upper dentition.  Findings are consistent with gingivitis.  No appreciable abscess.  Patient was recently treated with antibiotics for a dental infection.  Patient states that the tooth feels better but now  the "gum hurts."  This is consistent with my exam.  Given recent dental infection, ongoing symptoms however I will cover with further antibiotics as well as chlorhexidine mouthwash and Magic mouthwash for symptom relief.  Follow-up with dentist..  Patient is given ED precautions to return to the ED for any worsening or new symptoms.     ____________________________________________  FINAL CLINICAL IMPRESSION(S) / ED DIAGNOSES  Final diagnoses:  Gingivitis      NEW MEDICATIONS STARTED DURING THIS VISIT:  ED Discharge Orders         Ordered    clindamycin (CLEOCIN) 300 MG capsule  4 times daily     06/27/18 2015    chlorhexidine (PERIDEX) 0.12 % solution  2 times daily     06/27/18 2015    magic mouthwash w/lidocaine SOLN  4 times daily    Note to Pharmacy:  Dispense in a 1/1/1 ratio. Use lidocaine, diphenhydramine, prednisolone   06/27/18 2015              This chart was dictated using voice recognition software/Dragon. Despite best efforts to proofread, errors can occur which  can change the meaning. Any change was purely unintentional.    Racheal Patches, PA-C 06/27/18 2015    Emily Filbert, MD 06/27/18 2211

## 2018-06-27 NOTE — ED Notes (Signed)
Pt to the er for pain and swelling in her gums. Pt was placed on amoxicillin by her dentist for an abscess but infection is not gone. Pt thinks it has spread. Plan is to pull the tooth once the infection is gone.

## 2018-06-27 NOTE — ED Triage Notes (Signed)
Pt reports gum pain for 2 days.  Pt has left upper tooth ache last week.  Pt taking motrin without relief.  Pt alert.

## 2018-06-27 NOTE — ED Notes (Signed)
Patient given Medications. Tolerated well. Resting.

## 2018-06-27 NOTE — ED Notes (Signed)
Patient AAOx4. Vitals Stable. NAD. 

## 2018-07-22 ENCOUNTER — Emergency Department
Admission: EM | Admit: 2018-07-22 | Discharge: 2018-07-22 | Disposition: A | Discharge status: Home / Self Care | Payer: No Typology Code available for payment source | Attending: Emergency Medicine | Admitting: Emergency Medicine

## 2018-07-22 ENCOUNTER — Emergency Department: Payer: No Typology Code available for payment source

## 2018-07-22 ENCOUNTER — Encounter: Payer: Self-pay | Admitting: Emergency Medicine

## 2018-07-22 ENCOUNTER — Other Ambulatory Visit: Payer: Self-pay

## 2018-07-22 DIAGNOSIS — I10 Essential (primary) hypertension: Secondary | ICD-10-CM | POA: Insufficient documentation

## 2018-07-22 DIAGNOSIS — F17210 Nicotine dependence, cigarettes, uncomplicated: Secondary | ICD-10-CM | POA: Diagnosis not present

## 2018-07-22 DIAGNOSIS — Z79899 Other long term (current) drug therapy: Secondary | ICD-10-CM | POA: Diagnosis not present

## 2018-07-22 DIAGNOSIS — M545 Low back pain: Secondary | ICD-10-CM | POA: Insufficient documentation

## 2018-07-22 DIAGNOSIS — R51 Headache: Secondary | ICD-10-CM | POA: Diagnosis not present

## 2018-07-22 LAB — URINALYSIS, COMPLETE (UACMP) WITH MICROSCOPIC
Bilirubin Urine: NEGATIVE
Glucose, UA: NEGATIVE mg/dL
Ketones, ur: NEGATIVE mg/dL
Nitrite: POSITIVE — AB
Protein, ur: NEGATIVE mg/dL
Specific Gravity, Urine: 1.024 (ref 1.005–1.030)
pH: 5 (ref 5.0–8.0)

## 2018-07-22 LAB — POCT PREGNANCY, URINE: Preg Test, Ur: NEGATIVE

## 2018-07-22 MED ORDER — METHOCARBAMOL 500 MG PO TABS: 500.0000 mg | ORAL_TABLET | Freq: Three times a day (TID) | ORAL | 0 refills | Status: AC | PRN

## 2018-07-22 MED ORDER — MELOXICAM 15 MG PO TABS: 15.0000 mg | ORAL_TABLET | Freq: Every day | ORAL | 1 refills | Status: AC

## 2018-07-22 MED ORDER — CEPHALEXIN 500 MG PO CAPS: 500.0000 mg | ORAL_CAPSULE | Freq: Three times a day (TID) | ORAL | 0 refills | Status: AC
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
## 2018-12-01 ENCOUNTER — Other Ambulatory Visit: Payer: Self-pay

## 2018-12-01 ENCOUNTER — Emergency Department
Admission: EM | Admit: 2018-12-01 | Discharge: 2018-12-01 | Disposition: A | Discharge status: Home / Self Care | Payer: Self-pay | Attending: Emergency Medicine | Admitting: Emergency Medicine

## 2018-12-01 ENCOUNTER — Encounter: Payer: Self-pay | Admitting: Emergency Medicine

## 2018-12-01 DIAGNOSIS — F17210 Nicotine dependence, cigarettes, uncomplicated: Secondary | ICD-10-CM | POA: Insufficient documentation

## 2018-12-01 DIAGNOSIS — I10 Essential (primary) hypertension: Secondary | ICD-10-CM | POA: Insufficient documentation

## 2018-12-01 DIAGNOSIS — Z79899 Other long term (current) drug therapy: Secondary | ICD-10-CM | POA: Insufficient documentation

## 2018-12-01 DIAGNOSIS — H9201 Otalgia, right ear: Secondary | ICD-10-CM | POA: Insufficient documentation

## 2018-12-01 MED ORDER — NAPROXEN 500 MG PO TABS: 500.0000 mg | ORAL_TABLET | Freq: Two times a day (BID) | ORAL | Status: DC

## 2018-12-01 MED ORDER — IBUPROFEN 600 MG PO TABS
600.0000 mg | ORAL_TABLET | Freq: Once | ORAL | Status: AC
  Administered 2018-12-01: 600 mg via ORAL
  Filled 2018-12-01: qty 1

## 2018-12-01 MED ORDER — FEXOFENADINE-PSEUDOEPHED ER 60-120 MG PO TB12: 1.0000 | ORAL_TABLET | Freq: Two times a day (BID) | ORAL | 0 refills | Status: DC

## 2018-12-01 MED ORDER — TRAMADOL HCL 50 MG PO TABS
50.0000 mg | ORAL_TABLET | Freq: Once | ORAL | Status: AC
  Administered 2018-12-01: 50 mg via ORAL
  Filled 2018-12-01: qty 1

## 2018-12-01 MED ORDER — CLARITHROMYCIN 250 MG PO TABS: 250.0000 mg | ORAL_TABLET | Freq: Two times a day (BID) | ORAL | 0 refills | Status: AC

## 2018-12-01 NOTE — ED Notes (Signed)
See triage note  Presents with right ear pain  States pain became worse yesterday  Denies any fever

## 2018-12-01 NOTE — ED Triage Notes (Signed)
Patient ambulatory to triage with steady gait, without difficulty or distress noted, mask in place; pt report rt earache for several wks; denies any recent illness or fever

## 2018-12-01 NOTE — ED Provider Notes (Signed)
[facility] Emergency Department Provider Note   ____________________________________________   First MD Initiated Contact with Patient 12/01/18 [phone]     (approximate)  I have reviewed the triage vital signs and the nursing notes.   HISTORY  Chief Complaint Otalgia    HPI Robin Carroll is a 23 y.o. female patient complaining of right ear pain for several weeks.  Patient state mild hearing loss.  Patient denies URI signs and symptoms.  Denies vertigo.  Patient rates pain as a 10/10.  Patient described pain as "pressure".  No relief over-the-counter medications.         Past Medical History:  Diagnosis Date  . Hypertension     There are no active problems to display for this patient.   History reviewed. No pertinent surgical history.  Prior to Admission medications   Medication Sig Start Date End Date Taking? Authorizing Provider  clarithromycin (BIAXIN) 250 MG tablet Take 1 tablet (250 mg total) by mouth 2 (two) times daily for 14 days. 12/01/18 12/15/18  Joni Reining, PA-C  fexofenadine-pseudoephedrine (ALLEGRA-D) 60-120 MG 12 hr tablet Take 1 tablet by mouth 2 (two) times daily. 12/01/18   Joni Reining, PA-C  naproxen (NAPROSYN) 500 MG tablet Take 1 tablet (500 mg total) by mouth 2 (two) times daily with a meal. 12/01/18   Joni Reining, PA-C    Allergies Penicillins  No family history on file.  Social History Social History   Tobacco Use  . Smoking status: Current Every Day Smoker    Packs/day: 0.50    Types: Cigarettes  . Smokeless tobacco: Never Used  Substance Use Topics  . Alcohol use: No    Frequency: Never  . Drug use: No    Review of Systems  Constitutional: No fever/chills Eyes: No visual changes. ENT: No sore throat.  Right ear pain Cardiovascular: Denies chest pain. Respiratory: Denies shortness of breath. Gastrointestinal: No abdominal pain.  No nausea, no vomiting.  No diarrhea.  No constipation.  Genitourinary: Negative for dysuria. Musculoskeletal: Negative for back pain. Skin: Negative for rash. Neurological: Negative for headaches, focal weakness or numbness. Endocrine:  Hypertension Allergic/Immunilogical: Penicillin ____________________________________________   PHYSICAL EXAM:  VITAL SIGNS: ED Triage Vitals  Enc Vitals Group     BP 12/01/18 0411 132/80     Pulse Rate 12/01/18 0411 70     Resp 12/01/18 0411 18     Temp 12/01/18 0411 98.2 F (36.8 C)     Temp Source 12/01/18 0411 Oral     SpO2 12/01/18 0411 98 %     Weight 12/01/18 0409 160 lb (72.6 kg)     Height 12/01/18 0409 5\' 7"  (1.702 m)     Head Circumference --      Peak Flow --      Pain Score 12/01/18 0409 10     Pain Loc --      Pain Edu? --      Excl. in GC? --     Constitutional: Alert and oriented. Well appearing and in no acute distress. Nose: No congestion/rhinnorhea. EARS: Edematous and erythematous right TM. Mouth/Throat: Mucous membranes are moist.  Oropharynx non-erythematous. Neck: No stridor.  Hematological/Lymphatic/Immunilogical: No cervical lymphadenopathy. Cardiovascular: Normal rate, regular rhythm. Grossly normal heart sounds.  Good peripheral circulation. Respiratory: Normal respiratory effort.  No retractions. Lungs CTAB. Neurologic:  Normal speech and language. No gross focal neurologic deficits are appreciated. No gait instability. Skin:  Skin is warm, dry and intact. No rash  noted. Psychiatric: Mood and affect are normal. Speech and behavior are normal.  ____________________________________________   LABS (all labs ordered are listed, but only abnormal results are displayed)  Labs Reviewed - No data to display ____________________________________________  EKG   ____________________________________________  RADIOLOGY  ED MD interpretation:    Official radiology report(s): No results found.  ____________________________________________   PROCEDURES   Procedure(s) performed (including Critical Care):  Procedures   ____________________________________________   INITIAL IMPRESSION / ASSESSMENT AND PLAN / ED COURSE  As part of my medical decision making, I reviewed the following data within the [place] was evaluated in Emergency Department on 12/01/2018 for the symptoms described in the history of present illness. She was evaluated in the context of the global COVID-19 pandemic, which necessitated consideration that the patient might be at risk for infection with the SARS-CoV-2 virus that causes COVID-19. Institutional protocols and algorithms that pertain to the evaluation of patients at risk for COVID-19 are in a state of rapid change based on information released by regulatory bodies including the CDC and federal and state organizations. These policies and algorithms were followed during the patient's care in the ED.  Patient presents with 1 week of right ear pain with mild hearing loss.  Physical exam is consistent with right otitis media.  Patient given discharge care instruction work note.  Patient advised take medication as directed.  [facility]      ____________________________________________   FINAL CLINICAL IMPRESSION(S) / ED DIAGNOSES  Final diagnoses:  Right ear pain     ED Discharge Orders         Ordered    clarithromycin (BIAXIN) 250 MG tablet  2 times daily     12/01/18 0713    fexofenadine-pseudoephedrine (ALLEGRA-D) 60-120 MG 12 hr tablet  2 times daily     12/01/18 0713    naproxen (NAPROSYN) 500 MG tablet  2 times daily with meals     12/01/18 7124           Note:  This document was prepared using Dragon voice recognition software and may include unintentional dictation errors.    Sable Feil, PA-C 12/01/18 5809    Earleen Newport, MD 12/01/18 [phone]

## 2019-07-17 ENCOUNTER — Telehealth: Payer: No Typology Code available for payment source | Admitting: Emergency Medicine

## 2019-07-17 DIAGNOSIS — L709 Acne, unspecified: Secondary | ICD-10-CM

## 2019-07-17 MED ORDER — DOXYCYCLINE HYCLATE 100 MG PO CAPS: 100.0000 mg | ORAL_CAPSULE | Freq: Two times a day (BID) | ORAL | 0 refills | Status: DC

## 2019-07-17 MED ORDER — CLINDAMYCIN PHOS-BENZOYL PEROX 1-5 % EX GEL: Freq: Two times a day (BID) | CUTANEOUS | 0 refills | Status: DC

## 2019-07-17 NOTE — Addendum Note (Signed)
Addended by: Roxy Horseman B on: 07/17/2019 02:53 PM   Modules accepted: Orders

## 2019-07-17 NOTE — Progress Notes (Signed)
We are sorry that you are experiencing this issue.  Here is how we plan to help!  Based on what you shared with me it looks like you have uncomplicated acne.  The picture you posted wasn't very clear, but it seemed fairly isolated to one location.    Acne is a disorder of the hair follicles and oil glands (sebaceous glands). The sebaceous glands secrete oils to keep the skin moist.  When the glands get clogged, it can lead to pimples or cysts.  These cysts may become infected and leave scars. Acne is very common and normally occurs at puberty.  Acne is also inherited.  Your personal care plan consists of the following recommendations:  I recommend that you use a daily cleanser  You might try an over the counter cleanser that has benzoyl peroxide.  I recommend that you start with a product that has 2.5% benzoyl peroxide.  Stronger concentrations have not been shown to be more effective.  I have prescribed a topical gel with an antibiotic:  Clindamycin-benzoyl peroxide gel.  This gel should be applied to the affected areas twice a day.  Be sure to read the package insert to understand potential side effects.    If excessive dryness or peeling occurs, reduce dose frequency or concentration of the topical scrubs.  If excessive stinging or burning occurs, remove the topical gel with mild soap and water and resume at a lower dose the next day.  Remember oral antibiotics and topical acne treatments may increase your sensitivity to the sun!  HOME CARE:  Do not squeeze pimples because that can often lead to infections, worse acne, and scars.  Use a moisturizer that contains retinoid or fruit acids that may inhibit the development of new acne lesions.  Although there is not a clear link that foods can cause acne, doctors do believe that too many sweets predispose you to skin problems.  GET HELP RIGHT AWAY IF:  If your acne gets worse or is not better within 10 days.  If you become  depressed.  If you become pregnant, discontinue medications and call your OB/GYN.  MAKE SURE YOU:  Understand these instructions.  Will watch your condition.  Will get help right away if you are not doing well or get worse.   Your e-visit answers were reviewed by a board certified advanced clinical practitioner to complete your personal care plan.  Depending upon the condition, your plan could have included both over the counter or prescription medications.  Please review your pharmacy choice.  If there is a problem, you may contact your provider through [employer] and have the prescription routed to another pharmacy.  Your safety is important to [place].  If you have drug allergies check your prescription carefully.  For the next 24 hours you can use MyChart to ask questions about today's visit, request a non-urgent call back, or ask for a work or school excuse from your e-visit provider.  You will get an email in the next two days asking about your experience. I hope that your e-visit has been valuable and will speed your recovery.  Approximately 5 minutes was used in reviewing the patient's chart, questionnaire, prescribing medications, and documentation.

## 2019-08-08 ENCOUNTER — Telehealth: Payer: Self-pay

## 2019-08-08 NOTE — Telephone Encounter (Signed)
Individual has been contacted 3+ times regarding ED referral and has been given information regarding the clinic. No further attempts to contact the individual will be made. 

## 2019-12-20 ENCOUNTER — Telehealth: Payer: No Typology Code available for payment source | Admitting: Orthopedic Surgery

## 2019-12-20 ENCOUNTER — Telehealth: Payer: No Typology Code available for payment source | Admitting: Emergency Medicine

## 2019-12-20 DIAGNOSIS — H5712 Ocular pain, left eye: Secondary | ICD-10-CM

## 2019-12-20 DIAGNOSIS — H5711 Ocular pain, right eye: Secondary | ICD-10-CM

## 2019-12-20 NOTE — Progress Notes (Signed)
Based on what you shared with me, I feel your condition warrants further evaluation and I recommend that you be seen for a face to face office visit.  You're picture looks like a condition known as a subconjuntival hemorrhage. It's not dangerous but is usually painless. It may also be a sign of something called scleritis or episcleritis. These conditions are more urgent and should be seen promptly. I would advise you to see an eye doctor or go to an urgent care or the emergency department first thing in the morning (you could also go tonight).   This does not look nor sound like "pink eye".  I hope you feel better soon.   NOTE: If you entered your credit card information for this eVisit, you will not be charged. You may see a "hold" on your card for the $35 but that hold will drop off and you will not have a charge processed.   If you are having a true medical emergency please call 911.      For an urgent face to face visit, [place] has five urgent care centers for your convenience:     [facility] at [facility] Directions [phone] [address] [place], [place] [address] . 10 am - 6pm Monday - Friday    [facility]) Get Driving Directions [phone] [address] [place], [place] [address] . 10 am to 8 pm Monday-Friday . 12 pm to 8 pm [facility] at [facility] Get Driving Directions [phone] [address] [place], [place] [address] . 8 am to 8 pm Monday-Friday . 9 am to 6 pm Saturday . 11 am to 6 pm Sunday     [facility] Health Urgent Care at [facility] Get Driving Directions  [phone] [address] [place], [place] [address] . 8 am to 8 pm Monday-Friday . 8 am to 4 pm [facility] Urgent Care at [facility] Directions [phone] [address] [place], [place]  [address] . 12 pm to 6 pm Monday-Friday      Your e-visit answers were reviewed by a board certified advanced clinical practitioner to complete your personal care plan.  Thank you for using e-Visits.   Greater than 5 minutes, yet less than 10 minutes of time have been spent researching, coordinating and implementing care for this patient today.

## 2019-12-21 ENCOUNTER — Encounter: Payer: Self-pay | Admitting: Emergency Medicine

## 2019-12-21 ENCOUNTER — Other Ambulatory Visit: Payer: Self-pay

## 2019-12-21 ENCOUNTER — Emergency Department
Admission: EM | Admit: 2019-12-21 | Discharge: 2019-12-21 | Disposition: A | Discharge status: Home / Self Care | Payer: Self-pay | Attending: Student in an Organized Health Care Education/Training Program | Admitting: Student in an Organized Health Care Education/Training Program

## 2019-12-21 DIAGNOSIS — H1132 Conjunctival hemorrhage, left eye: Secondary | ICD-10-CM | POA: Insufficient documentation

## 2019-12-21 DIAGNOSIS — I10 Essential (primary) hypertension: Secondary | ICD-10-CM | POA: Insufficient documentation

## 2019-12-21 DIAGNOSIS — F17210 Nicotine dependence, cigarettes, uncomplicated: Secondary | ICD-10-CM | POA: Insufficient documentation

## 2019-12-21 NOTE — ED Triage Notes (Signed)
Pt to ED via POV with c/o 5/10 burning and redness to L eye. Pt states started several days ago and has worsened since then.

## 2019-12-21 NOTE — ED Notes (Signed)
Visual acuity  Left 20/50 Right 20/20

## 2019-12-21 NOTE — ED Notes (Signed)
Patient declined discharged vital signs. 

## 2019-12-21 NOTE — ED Provider Notes (Signed)
[facility] Emergency Department Provider Note   ____________________________________________   First MD Initiated Contact with Patient 12/21/19 1102     (approximate)  I have reviewed the triage vital signs and the nursing notes.   HISTORY  Chief Complaint Eye Pain   HPI Robin Carroll is a 24 y.o. female presents to the ED with complaint of 2 days ago seeing a red spot in the corner of her arm that looked a little worse yesterday.  Patient had an ED visit and was told most likely it was a subconjunctival hemorrhage but because of the burning complaint patient was told to be seen at an urgent care or the emergency department.  Patient denies any direct trauma to her eye.  She does recall sneezing several times prior to noticing this area.  Patient also has seasonal allergies.  She is unaware of any visual changes.  She rates her discomfort as 5 out of 10.       Past Medical History:  Diagnosis Date  . Hypertension     There are no problems to display for this patient.   History reviewed. No pertinent surgical history.  Prior to Admission medications   Medication Sig Start Date End Date Taking? Authorizing Provider  clindamycin-benzoyl peroxide (BENZACLIN) gel Apply topically 2 (two) times daily. 07/17/19   Roxy Horseman, PA-C  fexofenadine-pseudoephedrine (ALLEGRA-D) 60-120 MG 12 hr tablet Take 1 tablet by mouth 2 (two) times daily. 12/01/18   Joni Reining, PA-C    Allergies Penicillins  No family history on file.  Social History Social History   Tobacco Use  . Smoking status: Current Every Day Smoker    Packs/day: 0.50    Types: Cigarettes  . Smokeless tobacco: Never Used  Substance Use Topics  . Alcohol use: No  . Drug use: No    Review of Systems Constitutional: No fever/chills Eyes: No visual changes.  Left eye redness. ENT: No sore throat.  Positive sneezing. Cardiovascular: Denies chest pain. Respiratory: Denies  shortness of breath. Gastrointestinal: No abdominal pain.  No nausea, no vomiting.  Musculoskeletal: Negative for muscle skeletal pain. Skin: Negative for rash. Neurological: Negative for headaches, focal weakness or numbness. ____________________________________________   PHYSICAL EXAM:  VITAL SIGNS: ED Triage Vitals  Enc Vitals Group     BP 12/21/19 0948 (!) 131/92     Pulse Rate 12/21/19 0948 78     Resp 12/21/19 0948 20     Temp 12/21/19 0948 98.4 F (36.9 C)     Temp Source 12/21/19 0948 Oral     SpO2 12/21/19 0948 100 %     Weight 12/21/19 0949 175 lb (79.4 kg)     Height 12/21/19 0949 5\' 7"  (1.702 m)     Head Circumference --      Peak Flow --      Pain Score 12/21/19 0944 5     Pain Loc --      Pain Edu? --      Excl. in GC? --     Constitutional: Alert and oriented. Well appearing and in no acute distress. Eyes: Conjunctivae on the right is normal.  Left conjunctival medial aspect with subconjunctival hemorrhage.  PERRL. EOMI. visual acuity was noted with right eye 20/20 and left eye 20/50.  No photophobia noted. Head: Atraumatic. Nose: No congestion/rhinnorhea. Neck: No stridor.   Cardiovascular: Normal rate, regular rhythm. Grossly normal heart sounds.  Good peripheral circulation. Respiratory: Normal respiratory effort.  No retractions. Lungs CTAB.  Musculoskeletal: Moves upper and lower extremities that any difficulty normal gait was noted. Neurologic:  Normal speech and language. No gross focal neurologic deficits are appreciated. No gait instability. Skin:  Skin is warm, dry and intact. No rash noted. Psychiatric: Mood and affect are normal. Speech and behavior are normal.  ____________________________________________   LABS (all labs ordered are listed, but only abnormal results are displayed)  Labs Reviewed - No data to display ____________________________________________  PROCEDURES  Procedure(s) performed (including Critical  Care):  Procedures   ____________________________________________   INITIAL IMPRESSION / ASSESSMENT AND PLAN / ED COURSE  As part of my medical decision making, I reviewed the following data within the electronic MEDICAL RECORD NUMBER Notes from prior ED visits and  Controlled Substance Database  24 year old female presents to the ED with complaint and concerns of left eye redness.  Patient recalls sneezing a few times and then 2 days ago noticed some erythema.  She had an ED visit and was told most likely it was a subconjunctival hemorrhage however they also told her to follow-up with urgent care, and [facility] or the emergency department for any continued concerns.  On exam there is a localized subconjunctival hemorrhage left eye medial aspect.  Visual acuity was 20/20 in the right eye and 20/50 in the left eye.  Patient was told to follow-up with [facility] if any continued problems or worsening. ____________________________________________   FINAL CLINICAL IMPRESSION(S) / ED DIAGNOSES  Final diagnoses:  Subconjunctival hemorrhage of left eye     ED Discharge Orders    None      *Please note:  [name] was evaluated in Emergency Department on 12/21/2019 for the symptoms described in the history of present illness. She was evaluated in the context of the global COVID-19 pandemic, which necessitated consideration that the patient might be at risk for infection with the SARS-CoV-2 virus that causes COVID-19. Institutional protocols and algorithms that pertain to the evaluation of patients at risk for COVID-19 are in a state of rapid change based on information released by regulatory bodies including the CDC and federal and state organizations. These policies and algorithms were followed during the patient's care in the ED.  Some ED evaluations and interventions may be delayed as a result of limited staffing during and the pandemic.*   Note:  This document was prepared using  Dragon voice recognition software and may include unintentional dictation errors.    Tommi Rumps, PA-C 12/21/19 1210    Willy Eddy, MD 12/21/19 1310

## 2019-12-21 NOTE — Discharge Instructions (Signed)
Follow-up with Dr. Druscilla Brownie who is on-call for [facility] if any continued problems or not improving.  The blood that she see in your left eye should gradually disappear.  Until it is completely disappeared it may show up as a bruise turning from red, to purple, to green and then to yellow before it fades.  Avoid taking anti-inflammatories at this time however he can take Tylenol if needed.  Avoid bright sunlight and wear sunglasses.  Do not rub your eye as this may cause it to look worse.  Today your vision in your right eye was 20/20 and left eye was 20/50.

## 2019-12-21 NOTE — Progress Notes (Signed)
Because you are having eye pain along with this redness, I think it warrants further evaluation in person. I recommend that you be seen for a face to face office visit.   NOTE: If you entered your credit card information for this eVisit, you will not be charged. You may see a "hold" on your card for the $35 but that hold will drop off and you will not have a charge processed.   If you are having a true medical emergency please call 911.      For an urgent face to face visit, [place] has five urgent care centers for your convenience:     [facility] Urgent Care Center at [facility] Directions [phone] [address] [place], [place] [address] . 10 am - 6pm Monday - Friday    [facility] Center [facility]) Get Driving Directions [phone] [address] [place], [place] [address] . 10 am to 8 pm Monday-Friday . 12 pm to 8 pm [facility] Urgent Care at [facility] Get Driving Directions [phone] [address] [place], [place] [address] . 8 am to 8 pm Monday-Friday . 9 am to 6 pm Saturday . 11 am to 6 pm Sunday     [facility] Health Urgent Care at [facility] Get Driving Directions  [phone] [address] [place], [place] [address] . 8 am to 8 pm Monday-Friday . 8 am to 4 pm [facility] Urgent Care at [facility] Directions [phone] [address] [place], [place] [address] . 12 pm to 6 pm Monday-Friday      Your e-visit answers were reviewed by a board certified advanced clinical practitioner to complete your personal care plan.  Thank you for using e-Visits.    Approximately 5 minutes was used in reviewing the patient's chart, questionnaire, prescribing medications, and documentation.

## 2019-12-21 NOTE — ED Notes (Signed)
Pt reports 2 days ago she started with a red spot in the corner of her eye but since then it has spread over her left eye. Pt reports has a burning pain to it and it waters. Pt reports her vision is also getting blurry in that eye. Pt states she did an e-visit but was told to come and see someone in person. Denies injuries, states she went to sleep one night woke up and it was like that

## 2020-02-13 ENCOUNTER — Emergency Department
Admission: EM | Admit: 2020-02-13 | Discharge: 2020-02-13 | Disposition: A | Discharge status: Home / Self Care | Payer: HRSA Program | Attending: Emergency Medicine | Admitting: Emergency Medicine

## 2020-02-13 ENCOUNTER — Other Ambulatory Visit: Payer: Self-pay

## 2020-02-13 ENCOUNTER — Emergency Department: Payer: HRSA Program

## 2020-02-13 DIAGNOSIS — R109 Unspecified abdominal pain: Secondary | ICD-10-CM | POA: Diagnosis not present

## 2020-02-13 DIAGNOSIS — R112 Nausea with vomiting, unspecified: Secondary | ICD-10-CM | POA: Insufficient documentation

## 2020-02-13 DIAGNOSIS — U071 COVID-19: Secondary | ICD-10-CM | POA: Diagnosis not present

## 2020-02-13 DIAGNOSIS — F17210 Nicotine dependence, cigarettes, uncomplicated: Secondary | ICD-10-CM | POA: Insufficient documentation

## 2020-02-13 DIAGNOSIS — R519 Headache, unspecified: Secondary | ICD-10-CM | POA: Diagnosis present

## 2020-02-13 DIAGNOSIS — I10 Essential (primary) hypertension: Secondary | ICD-10-CM | POA: Diagnosis not present

## 2020-02-13 DIAGNOSIS — R252 Cramp and spasm: Secondary | ICD-10-CM | POA: Insufficient documentation

## 2020-02-13 LAB — CBC WITH DIFFERENTIAL/PLATELET
Abs Immature Granulocytes: 0.07 10*3/uL (ref 0.00–0.07)
Basophils Absolute: 0 10*3/uL (ref 0.0–0.1)
Basophils Relative: 0 %
Eosinophils Absolute: 0 10*3/uL (ref 0.0–0.5)
Eosinophils Relative: 0 %
HCT: 47.3 % — ABNORMAL HIGH (ref 36.0–46.0)
Hemoglobin: 15.9 g/dL — ABNORMAL HIGH (ref 12.0–15.0)
Immature Granulocytes: 1 %
Lymphocytes Relative: 2 %
Lymphs Abs: 0.2 10*3/uL — ABNORMAL LOW (ref 0.7–4.0)
MCH: 29.9 pg (ref 26.0–34.0)
MCHC: 33.6 g/dL (ref 30.0–36.0)
MCV: 88.9 fL (ref 80.0–100.0)
Monocytes Absolute: 1.4 10*3/uL — ABNORMAL HIGH (ref 0.1–1.0)
Monocytes Relative: 13 %
Neutro Abs: 9.6 10*3/uL — ABNORMAL HIGH (ref 1.7–7.7)
Neutrophils Relative %: 84 %
Platelets: 247 10*3/uL (ref 150–400)
RBC: 5.32 MIL/uL — ABNORMAL HIGH (ref 3.87–5.11)
RDW: 13 % (ref 11.5–15.5)
WBC: 11.4 10*3/uL — ABNORMAL HIGH (ref 4.0–10.5)
nRBC: 0 % (ref 0.0–0.2)

## 2020-02-13 LAB — URINALYSIS, COMPLETE (UACMP) WITH MICROSCOPIC
Bacteria, UA: NONE SEEN
Bilirubin Urine: NEGATIVE
Glucose, UA: NEGATIVE mg/dL
Ketones, ur: 20 mg/dL — AB
Leukocytes,Ua: NEGATIVE
Nitrite: NEGATIVE
Protein, ur: NEGATIVE mg/dL
Specific Gravity, Urine: 1.014 (ref 1.005–1.030)
pH: 5 (ref 5.0–8.0)

## 2020-02-13 LAB — COMPREHENSIVE METABOLIC PANEL
ALT: 26 U/L (ref 0–44)
AST: 33 U/L (ref 15–41)
Albumin: 3.8 g/dL (ref 3.5–5.0)
Alkaline Phosphatase: 65 U/L (ref 38–126)
Anion gap: 12 (ref 5–15)
BUN: <5 mg/dL — ABNORMAL LOW (ref 6–20)
CO2: 19 mmol/L — ABNORMAL LOW (ref 22–32)
Calcium: 8.4 mg/dL — ABNORMAL LOW (ref 8.9–10.3)
Chloride: 106 mmol/L (ref 98–111)
Creatinine, Ser: 0.67 mg/dL (ref 0.44–1.00)
GFR, Estimated: >60 mL/min (ref >60)
Glucose, Bld: 117 mg/dL — ABNORMAL HIGH (ref 70–99)
Potassium: 3.7 mmol/L (ref 3.5–5.1)
Sodium: 137 mmol/L (ref 135–145)
Total Bilirubin: 0.6 mg/dL (ref 0.3–1.2)
Total Protein: 6.9 g/dL (ref 6.5–8.1)

## 2020-02-13 LAB — LIPASE, BLOOD: Lipase: 18 U/L (ref 11–51)

## 2020-02-13 LAB — CK: Total CK: 75 U/L (ref 38–234)

## 2020-02-13 LAB — POC URINE PREG, ED: Preg Test, Ur: NEGATIVE

## 2020-02-13 LAB — MAGNESIUM: Magnesium: 1.5 mg/dL — ABNORMAL LOW (ref 1.7–2.4)

## 2020-02-13 MED ORDER — PROMETHAZINE HCL 25 MG/ML IJ SOLN
12.5000 mg | Freq: Once | INTRAMUSCULAR | Status: AC
  Administered 2020-02-13: 12.5 mg via INTRAVENOUS
  Filled 2020-02-13: qty 1

## 2020-02-13 MED ORDER — MORPHINE SULFATE (PF) 4 MG/ML IV SOLN
INTRAVENOUS | Status: AC
  Administered 2020-02-13: 4 mg via INTRAVENOUS
  Filled 2020-02-13: qty 1

## 2020-02-13 MED ORDER — IOHEXOL 300 MG/ML  SOLN
100.0000 mL | Freq: Once | INTRAMUSCULAR | Status: AC | PRN
  Administered 2020-02-13: 100 mL via INTRAVENOUS

## 2020-02-13 MED ORDER — LACTATED RINGERS IV BOLUS
1000.0000 mL | Freq: Once | INTRAVENOUS | Status: AC
  Administered 2020-02-13: 1000 mL via INTRAVENOUS

## 2020-02-13 MED ORDER — ONDANSETRON HCL 4 MG/2ML IJ SOLN
4.0000 mg | Freq: Once | INTRAMUSCULAR | Status: AC
  Administered 2020-02-13: 4 mg via INTRAVENOUS
  Filled 2020-02-13: qty 2

## 2020-02-13 MED ORDER — MAGNESIUM SULFATE 2 GM/50ML IV SOLN
2.0000 g | Freq: Once | INTRAVENOUS | Status: AC
  Administered 2020-02-13: 2 g via INTRAVENOUS
  Filled 2020-02-13: qty 50

## 2020-02-13 MED ORDER — MORPHINE SULFATE (PF) 4 MG/ML IV SOLN: 4.0000 mg | Freq: Once | INTRAVENOUS | Status: AC

## 2020-02-13 MED ORDER — POTASSIUM CHLORIDE 20 MEQ PO PACK
40.0000 meq | PACK | Freq: Every day | ORAL | Status: DC
  Administered 2020-02-13: 40 meq via ORAL
  Filled 2020-02-13: qty 2

## 2020-02-13 MED ORDER — MORPHINE SULFATE (PF) 2 MG/ML IV SOLN
2.0000 mg | Freq: Once | INTRAVENOUS | Status: AC
  Administered 2020-02-13: 2 mg via INTRAVENOUS
  Filled 2020-02-13: qty 1

## 2020-02-13 NOTE — ED Provider Notes (Signed)
[facility] Emergency Department Provider Note   ____________________________________________   Event Date/Time   First MD Initiated Contact with Patient 02/13/20 1309     (approximate)  I have reviewed the triage vital signs and the nursing notes.   HISTORY  Chief Complaint Abdominal Pain    HPI Robin Carroll is a 25 y.o. female who comes in complaining of cramps in her arms and legs back muscles etc.  She is also having some nausea and vomiting.  She complains of a headache and a temperature of 101.  She reports yesterday she went to donate plasma and something happened with the machine and they could not give her all of her blood back so she is also very lightheaded feeling.  Here her temperature is 98 blood pressure heart rate and respiratory rate are within normal limits.         Past Medical History:  Diagnosis Date  . Hypertension     There are no problems to display for this patient.   History reviewed. No pertinent surgical history.  Prior to Admission medications   Medication Sig Start Date End Date Taking? Authorizing Provider  clindamycin-benzoyl peroxide (BENZACLIN) gel Apply topically 2 (two) times daily. 07/17/19   Roxy Horseman, PA-C  fexofenadine-pseudoephedrine (ALLEGRA-D) 60-120 MG 12 hr tablet Take 1 tablet by mouth 2 (two) times daily. 12/01/18   Joni Reining, PA-C    Allergies Penicillins  No family history on file.  Social History Social History   Tobacco Use  . Smoking status: Current Every Day Smoker    Packs/day: 0.50    Types: Cigarettes  . Smokeless tobacco: Never Used  Substance Use Topics  . Alcohol use: No  . Drug use: No    Review of Systems  Constitutional:  fever/chills Eyes: No visual changes. ENT: No sore throat. Cardiovascular: Denies chest pain. Respiratory: Denies shortness of breath. Gastrointestinal: No abdominal pain.  No nausea, no vomiting.  No diarrhea.  No  constipation. Genitourinary: Negative for dysuria. Musculoskeletal: Muscle cramps pretty much everywhere arms legs back etc. Skin: Negative for rash. Neurological: Negative for  focal weakness she does have a headache which is diffuse  ____________________________________________   PHYSICAL EXAM:  VITAL SIGNS: ED Triage Vitals  Enc Vitals Group     BP 02/13/20 1305 125/85     Pulse Rate 02/13/20 1305 70     Resp 02/13/20 1305 19     Temp 02/13/20 1305 98 F (36.7 C)     Temp src --      SpO2 02/13/20 1305 100 %     Weight --      Height --      Head Circumference --      Peak Flow --      Pain Score 02/13/20 1301 7     Pain Loc --      Pain Edu? --      Excl. in GC? --     Constitutional: Alert and oriented.  Is crying and complaining of cramps also having dry heaves Eyes: Conjunctivae are normal. PER Head: Atraumatic. Nose: No congestion/rhinnorhea. Mouth/Throat: Mucous membranes are moist.  Oropharynx non-erythematous. Neck: No stridor.  Cardiovascular: Normal rate, regular rhythm. Grossly normal heart sounds.  Good peripheral circulation. Respiratory: Normal respiratory effort.  No retractions. Lungs CTAB. Gastrointestinal: Soft and nontender. No distention. No abdominal bruits. Musculoskeletal: No lower extremity  edema.   Neurologic:  Normal speech and language. No gross focal neurologic deficits are appreciated.  Skin:  Skin is warm, dry and intact. No rash noted.   ____________________________________________   LABS (all labs ordered are listed, but only abnormal results are displayed)  Labs Reviewed  COMPREHENSIVE METABOLIC PANEL - Abnormal; Notable for the following components:      Result Value   CO2 19 (*)    Glucose, Bld 117 (*)    BUN <5 (*)    Calcium 8.4 (*)    All other components within normal limits  URINALYSIS, COMPLETE (UACMP) WITH MICROSCOPIC - Abnormal; Notable for the following components:   Color, Urine YELLOW (*)    APPearance HAZY  (*)    Hgb urine dipstick SMALL (*)    Ketones, ur 20 (*)    All other components within normal limits  CBC WITH DIFFERENTIAL/PLATELET - Abnormal; Notable for the following components:   WBC 11.4 (*)    RBC 5.32 (*)    Hemoglobin 15.9 (*)    HCT 47.3 (*)    Neutro Abs 9.6 (*)    Lymphs Abs 0.2 (*)    Monocytes Absolute 1.4 (*)    All other components within normal limits  MAGNESIUM - Abnormal; Notable for the following components:   Magnesium 1.5 (*)    All other components within normal limits  SARS CORONAVIRUS 2 (TAT 6-24 HRS)  LIPASE, BLOOD  CK  POC URINE PREG, ED   ____________________________________________  EKG   ____________________________________________  RADIOLOGY Jill Poling, personally viewed and evaluated these images (plain radiographs) as part of my medical decision making, as well as reviewing the written report by the radiologist.  ED MD interpretation:    Official radiology report(s): CT ABDOMEN PELVIS W CONTRAST  Result Date: 02/13/2020 CLINICAL DATA:  Acute nonlocalized abdominal pain. Nausea and vomiting. EXAM: CT ABDOMEN AND PELVIS WITH CONTRAST TECHNIQUE: Multidetector CT imaging of the abdomen and pelvis was performed using the standard protocol following bolus administration of intravenous contrast. CONTRAST:  OMNIPAQUE IOHEXOL 300 MG/ML  SOLN COMPARISON:  None. FINDINGS: Lower chest: Lung bases are clear. No focal airspace disease or pleural fluid. Hepatobiliary: No focal liver abnormality is seen. No gallstones, gallbladder wall thickening, or biliary dilatation. Pancreas: Unremarkable. No pancreatic ductal dilatation or surrounding inflammatory changes. Spleen: Normal in size without focal abnormality. Adrenals/Urinary Tract: Normal adrenal glands. No hydronephrosis or perinephric edema. Homogeneous renal enhancement. Urinary bladder is only minimally distended, grossly unremarkable. Stomach/Bowel: Decompressed stomach. Normal positioning  of the duodenum and ligament of Treitz. There is no small bowel obstruction or inflammatory change. Terminal ileum is normal. The appendix is normal, for example series 2, image 62. Small volume of colonic stool. No colonic wall thickening or pericolonic edema. Vascular/Lymphatic: Normal caliber abdominal aorta. Patent portal vein and mesenteric vessels. No acute vascular findings. No enlarged lymph nodes in the abdomen or pelvis. Reproductive: Peripherally enhancing 18 mm cyst in the right ovary is likely a corpus luteum, physiologic. Small amount of free fluid in the pelvis and right adnexa. The uterus and left ovary are normal. Other: No upper abdominal ascites. No free air or focal fluid collection. Tiny fat containing umbilical hernia. Musculoskeletal: Segmentation anomalies in the thoracolumbar spine. 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 Hemi transitional lumbosacral anatomy. Bony fusion of L2-L3 vertebral bodies. No acute osseous abnormalities are seen. IMPRESSION: 1. Peripherally enhancing 18 mm cyst in the right ovary is likely a corpus luteum, physiologic. Small amount of free fluid in the pelvis and right adnexa. 2. No other acute  abnormality in the abdomen/pelvis. Normal appendix. 3. 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 Electronically Signed   By: Narda Rutherford M.D.   On: 02/13/2020 15:48    ____________________________________________   PROCEDURES  Procedure(s) performed (including Critical Care):  Procedures   ____________________________________________   INITIAL IMPRESSION / ASSESSMENT AND PLAN / ED COURSE  ----------------------------------------- 2:37 PM on 02/13/2020 -----------------------------------------  Patient comes continues to complain of cramps mostly now on the belly and somewhat in the left thigh.  She has had 6 of morphine at this point.  Electrolytes are okay white count is slightly elevated at  11.4 but with 84 polys.  She has not been able to produce urine at this point.  We will give her another liter of fluid as well.    ----------------------------------------- 5:49 PM on 02/13/2020 -----------------------------------------  Patient feeling back to normal now.  Cramps are gone.  I will let her go return if worse.  Not sure what the etiology of these cramps were.  Her lab work was not horribly out of whack and her CT was essentially normal.  It is possible that the mild amount of hypomagnesemia and hypokalemia cause the problems.  But after the magnesium and potassium and Phenergan she is better.  As I said I will let her go.  She can return if worse and certainly follow-up with her regular doctor.         ____________________________________________   FINAL CLINICAL IMPRESSION(S) / ED DIAGNOSES  Final diagnoses:  Abdominal pain, unspecified abdominal location     ED Discharge Orders    None      *Please note:  Robin Carroll was evaluated in Emergency Department on 02/13/2020 for the symptoms described in the history of present illness. She was evaluated in the context of the global COVID-19 pandemic, which necessitated consideration that the patient might be at risk for infection with the SARS-CoV-2 virus that causes COVID-19. Institutional protocols and algorithms that pertain to the evaluation of patients at risk for COVID-19 are in a state of rapid change based on information released by regulatory bodies including the CDC and federal and state organizations. These policies and algorithms were followed during the patient's care in the ED.  Some ED evaluations and interventions may be delayed as a result of limited staffing during and the pandemic.*   Note:  This document was prepared using Dragon voice recognition software and may include unintentional dictation errors.    Arnaldo Natal, MD 02/13/20 1750

## 2020-02-13 NOTE — ED Triage Notes (Signed)
Pt here via EMs from home with c/o abdominal pain, N/V. Pt states this started yesterday. Pt states leg cramps and that her feet feel numb and blue.  Feet assessed and pink with cap refill<3

## 2020-02-13 NOTE — Discharge Instructions (Addendum)
I think the abdominal pain and cramps may have been due to some dehydration.  Since you are better now I will let you go.  Please return for fever, vomiting or return of the pain or feeling sicker.  Please follow-up with your regular doctor in a week or so unless you are completely well.

## 2020-02-13 NOTE — ED Triage Notes (Signed)
First Nurse Note: Arrrives via [employer] from home.  C/O headache.  Per report temp:  101.  Also c/o n/v.  4 mg zofran given by EMS.  20 g right hand started.  CBG:  127.   P: 98.  VSS

## 2020-02-14 LAB — SARS CORONAVIRUS 2 (TAT 6-24 HRS): SARS Coronavirus 2: POSITIVE — AB

## 2020-03-17 ENCOUNTER — Encounter: Payer: Self-pay | Admitting: Emergency Medicine

## 2020-03-17 ENCOUNTER — Other Ambulatory Visit: Payer: Self-pay

## 2020-03-17 ENCOUNTER — Emergency Department
Admission: EM | Admit: 2020-03-17 | Discharge: 2020-03-17 | Disposition: A | Discharge status: Home / Self Care | Payer: Medicaid Other | Attending: Emergency Medicine | Admitting: Emergency Medicine

## 2020-03-17 ENCOUNTER — Emergency Department: Payer: Medicaid Other

## 2020-03-17 DIAGNOSIS — Z5321 Procedure and treatment not carried out due to patient leaving prior to being seen by health care provider: Secondary | ICD-10-CM | POA: Insufficient documentation

## 2020-03-17 DIAGNOSIS — R079 Chest pain, unspecified: Secondary | ICD-10-CM | POA: Insufficient documentation

## 2020-03-17 LAB — CBC
HCT: 50.2 % — ABNORMAL HIGH (ref 36.0–46.0)
Hemoglobin: 16.1 g/dL — ABNORMAL HIGH (ref 12.0–15.0)
MCH: 29.2 pg (ref 26.0–34.0)
MCHC: 32.1 g/dL (ref 30.0–36.0)
MCV: 90.9 fL (ref 80.0–100.0)
Platelets: 276 10*3/uL (ref 150–400)
RBC: 5.52 MIL/uL — ABNORMAL HIGH (ref 3.87–5.11)
RDW: 14.3 % (ref 11.5–15.5)
WBC: 17.6 10*3/uL — ABNORMAL HIGH (ref 4.0–10.5)
nRBC: 0 % (ref 0.0–0.2)

## 2020-03-17 LAB — BASIC METABOLIC PANEL
Anion gap: 12 (ref 5–15)
BUN: <5 mg/dL — ABNORMAL LOW (ref 6–20)
CO2: 23 mmol/L (ref 22–32)
Calcium: 8.6 mg/dL — ABNORMAL LOW (ref 8.9–10.3)
Chloride: 99 mmol/L (ref 98–111)
Creatinine, Ser: 0.62 mg/dL (ref 0.44–1.00)
GFR, Estimated: >60 mL/min (ref >60)
Glucose, Bld: 104 mg/dL — ABNORMAL HIGH (ref 70–99)
Potassium: 3.6 mmol/L (ref 3.5–5.1)
Sodium: 134 mmol/L — ABNORMAL LOW (ref 135–145)

## 2020-03-17 LAB — TROPONIN I (HIGH SENSITIVITY): Troponin I (High Sensitivity): 3 ng/L (ref <18)

## 2020-03-17 NOTE — ED Notes (Signed)
Sent rainbow,dark green and gray on ice to lab.

## 2020-03-17 NOTE — ED Notes (Signed)
Per Dr. Cyril Loosen, he attempted to contact pt and she states that she has left.

## 2020-03-17 NOTE — ED Triage Notes (Signed)
C/O left mid back, side and chest pain since last night. States pain worse when laying down.  Feels SOB as well.  Pain woke patient from sleep.   AAOx3.  Skin warm and dry. No SOB/ DOE  NAD

## 2020-03-19 ENCOUNTER — Other Ambulatory Visit: Payer: Self-pay

## 2020-03-19 ENCOUNTER — Emergency Department: Payer: Medicaid Other

## 2020-03-19 ENCOUNTER — Emergency Department
Admission: EM | Admit: 2020-03-19 | Discharge: 2020-03-19 | Disposition: A | Discharge status: Home / Self Care | Payer: Medicaid Other | Attending: Emergency Medicine | Admitting: Emergency Medicine

## 2020-03-19 DIAGNOSIS — F17210 Nicotine dependence, cigarettes, uncomplicated: Secondary | ICD-10-CM | POA: Insufficient documentation

## 2020-03-19 DIAGNOSIS — I10 Essential (primary) hypertension: Secondary | ICD-10-CM | POA: Insufficient documentation

## 2020-03-19 DIAGNOSIS — J181 Lobar pneumonia, unspecified organism: Secondary | ICD-10-CM | POA: Insufficient documentation

## 2020-03-19 DIAGNOSIS — J189 Pneumonia, unspecified organism: Secondary | ICD-10-CM

## 2020-03-19 LAB — BASIC METABOLIC PANEL
Anion gap: 10 (ref 5–15)
BUN: <5 mg/dL — ABNORMAL LOW (ref 6–20)
CO2: 24 mmol/L (ref 22–32)
Calcium: 8.5 mg/dL — ABNORMAL LOW (ref 8.9–10.3)
Chloride: 100 mmol/L (ref 98–111)
Creatinine, Ser: 0.55 mg/dL (ref 0.44–1.00)
GFR, Estimated: >60 mL/min (ref >60)
Glucose, Bld: 112 mg/dL — ABNORMAL HIGH (ref 70–99)
Potassium: 3 mmol/L — ABNORMAL LOW (ref 3.5–5.1)
Sodium: 134 mmol/L — ABNORMAL LOW (ref 135–145)

## 2020-03-19 LAB — CBC
HCT: 45.8 % (ref 36.0–46.0)
Hemoglobin: 14.9 g/dL (ref 12.0–15.0)
MCH: 29.5 pg (ref 26.0–34.0)
MCHC: 32.5 g/dL (ref 30.0–36.0)
MCV: 90.7 fL (ref 80.0–100.0)
Platelets: 256 10*3/uL (ref 150–400)
RBC: 5.05 MIL/uL (ref 3.87–5.11)
RDW: 14.1 % (ref 11.5–15.5)
WBC: 14.9 10*3/uL — ABNORMAL HIGH (ref 4.0–10.5)
nRBC: 0 % (ref 0.0–0.2)

## 2020-03-19 LAB — TROPONIN I (HIGH SENSITIVITY)
Troponin I (High Sensitivity): 3 ng/L (ref <18)
Troponin I (High Sensitivity): 3 ng/L (ref <18)

## 2020-03-19 MED ORDER — DOXYCYCLINE HYCLATE 50 MG PO CAPS: 100.0000 mg | ORAL_CAPSULE | Freq: Two times a day (BID) | ORAL | 0 refills | Status: AC

## 2020-03-19 NOTE — ED Triage Notes (Signed)
Pt comes via POV from home with c/o CP that radiates to her back. Pt states SOB. Pt states this started 4-5 days ago and just has gotten worse.

## 2020-03-19 NOTE — ED Notes (Signed)
Pt states coming in after several days of chest pain that radiates to her back and having shortness of breath. Pt states her fingers on the left hand are cold and she has tingling to her left hand and foot.

## 2020-03-20 NOTE — ED Provider Notes (Signed)
[facility] Emergency Department Provider Note   ____________________________________________   Event Date/Time   First MD Initiated Contact with Patient 03/19/20 1630     (approximate)  I have reviewed the triage vital signs and the nursing notes.   HISTORY  Chief Complaint Chest Pain    HPI Robin Carroll is a 25 y.o. female with stated past medical history of hypertension who presents for chest pain through to her back and is described as sharp, 5/10, and worsening over the last 4-5 days.  Patient endorses associated shortness of breath and hemoptysis.  Patient denies any recent travel or sick contacts.  Patient currently denies any vision changes, tinnitus, difficulty speaking, facial droop, sore throat, abdominal pain, nausea/vomiting/diarrhea, dysuria, or weakness/numbness/paresthesias in any extremity         Past Medical History:  Diagnosis Date  . Hypertension     There are no problems to display for this patient.   History reviewed. No pertinent surgical history.  Prior to Admission medications   Medication Sig Start Date End Date Taking? Authorizing Provider  doxycycline (VIBRAMYCIN) 50 MG capsule Take 2 capsules (100 mg total) by mouth 2 (two) times daily for 5 days. 03/19/20 03/24/20 Yes Aaniya Sterba, Clent Jacks, MD  clindamycin-benzoyl peroxide (BENZACLIN) gel Apply topically 2 (two) times daily. 07/17/19   Roxy Horseman, PA-C  fexofenadine-pseudoephedrine (ALLEGRA-D) 60-120 MG 12 hr tablet Take 1 tablet by mouth 2 (two) times daily. 12/01/18   Joni Reining, PA-C    Allergies Penicillins  No family history on file.  Social History Social History   Tobacco Use  . Smoking status: Current Every Day Smoker    Packs/day: 0.50    Types: Cigarettes  . Smokeless tobacco: Never Used  Substance Use Topics  . Alcohol use: No  . Drug use: No    Review of Systems Constitutional: No fever/chills Eyes: No visual changes. ENT: No sore  throat. Cardiovascular: Endorses chest pain. Respiratory: Endorses shortness of breath. Gastrointestinal: No abdominal pain.  No nausea, no vomiting.  No diarrhea. Genitourinary: Negative for dysuria. Musculoskeletal: Negative for acute arthralgias Skin: Negative for rash. Neurological: Negative for headaches, weakness/numbness/paresthesias in any extremity Psychiatric: Negative for suicidal ideation/homicidal ideation   ____________________________________________   PHYSICAL EXAM:  VITAL SIGNS: ED Triage Vitals  Enc Vitals Group     BP 03/19/20 1451 (!) 148/98     Pulse Rate 03/19/20 1451 (!) 101     Resp 03/19/20 1451 18     Temp 03/19/20 1451 99 F (37.2 C)     Temp Source 03/19/20 1451 Oral     SpO2 03/19/20 1451 96 %     Weight 03/19/20 1452 185 lb (83.9 kg)     Height 03/19/20 1452 5\' 6"  (1.676 m)     Head Circumference --      Peak Flow --      Pain Score 03/19/20 1445 7     Pain Loc --      Pain Edu? --      Excl. in GC? --    Constitutional: Alert and oriented. Well appearing and in no acute distress. Eyes: Conjunctivae are normal. PERRL. Head: Atraumatic. Nose: No congestion/rhinnorhea. Mouth/Throat: Mucous membranes are moist. Neck: No stridor Cardiovascular: Grossly normal heart sounds.  Good peripheral circulation. Respiratory: Normal respiratory effort.  No retractions.  Decreased breath sounds at left lower lung field Gastrointestinal: Soft and nontender. No distention. Musculoskeletal: No obvious deformities Neurologic:  Normal speech and language. No gross focal  neurologic deficits are appreciated. Skin:  Skin is warm and dry. No rash noted. Psychiatric: Mood and affect are normal. Speech and behavior are normal.  ____________________________________________   LABS (all labs ordered are listed, but only abnormal results are displayed)  Labs Reviewed  BASIC METABOLIC PANEL - Abnormal; Notable for the following components:      Result Value    Sodium 134 (*)    Potassium 3.0 (*)    Glucose, Bld 112 (*)    BUN <5 (*)    Calcium 8.5 (*)    All other components within normal limits  CBC - Abnormal; Notable for the following components:   WBC 14.9 (*)    All other components within normal limits  TROPONIN I (HIGH SENSITIVITY)  TROPONIN I (HIGH SENSITIVITY)   ____________________________________________  EKG  ED ECG REPORT I, Merwyn Katos, the attending physician, personally viewed and interpreted this ECG.  Date: 03/20/2020 EKG Time: 1447 Rate: 100 Rhythm: normal sinus rhythm QRS Axis: normal Intervals: normal ST/T Wave abnormalities: normal Narrative Interpretation: no evidence of acute ischemia  ____________________________________________  RADIOLOGY  ED MD interpretation: 2 view chest x-ray shows left lower lobe pneumonia  Official radiology report(s): No results found.  ____________________________________________   PROCEDURES  Procedure(s) performed (including Critical Care):  .1-3 Lead EKG Interpretation Performed by: Merwyn Katos, MD Authorized by: Merwyn Katos, MD     Interpretation: normal     ECG rate:  94   ECG rate assessment: normal     Rhythm: sinus rhythm     Ectopy: none     Conduction: normal       ____________________________________________   INITIAL IMPRESSION / ASSESSMENT AND PLAN / ED COURSE  As part of my medical decision making, I reviewed the following data within the electronic MEDICAL RECORD NUMBER Nursing notes reviewed and incorporated, Labs reviewed, EKG interpreted, Old chart reviewed, Radiograph reviewed and Notes from prior ED visits reviewed and incorporated        Presents with fever, shortness of breath, cough, and malaise concerning for pneumonia. DDx: PE, COPD exacerbation, Pneumothorax, TB, Atypical ACS, Esophageal Rupture, Toxic Exposure, Foreign Body Airway Obstruction. Workup: CXR CBC, CMP, Trop, Lactate  Given History, Exam, and Workup  presentation most consistent with pneumonia.  Findings:  Single Focus Consolidation on CXR  Rx: Amoxicillin 1 g three times x7days Doxycycline 100mg  BID x10days Reassessment: Patient resting comfortably in no respiratory distress.  Patient not requiring any supplemental oxygen and agrees with plan for discharge home.  Disposition: Discharge home. Return precautions discussed at bedside and patient in agreement with plan. Prompt follow up with primary care provider advised.      ____________________________________________   FINAL CLINICAL IMPRESSION(S) / ED DIAGNOSES  Final diagnoses:  Community acquired pneumonia of left lower lobe of lung     ED Discharge Orders         Ordered    doxycycline (VIBRAMYCIN) 50 MG capsule  2 times daily        03/19/20 1647           Note:  This document was prepared using Dragon voice recognition software and may include unintentional dictation errors.   03/21/20, MD 03/20/20 1550

## 2020-04-08 ENCOUNTER — Telehealth: Payer: Medicaid Other | Admitting: Physician Assistant

## 2020-04-08 DIAGNOSIS — K047 Periapical abscess without sinus: Secondary | ICD-10-CM

## 2020-04-08 MED ORDER — CLINDAMYCIN HCL 300 MG PO CAPS: 300.0000 mg | ORAL_CAPSULE | Freq: Four times a day (QID) | ORAL | 0 refills | Status: AC

## 2020-04-08 NOTE — Progress Notes (Signed)
E-Visit for Dental Pain  We are sorry that you are not feeling well.  Here is how we plan to help!  Based on what you have shared with me in the questionnaire, it sounds like you have dental infection/abscess   You can take OTC Ibuprofen 600mg  3 times a day for 7 days for discomfort and I have sent in a prescription for Clindamycin 300mg  4 times per day for 5 days since you are allergic to penicillins.  It is imperative that you see a dentist within 10 days of this eVisit to determine the cause of the dental pain and be sure it is adequately treated  A toothache or tooth pain is caused when the nerve in the root of a tooth or surrounding a tooth is irritated. Dental (tooth) infection, decay, injury, or loss of a tooth are the most common causes of dental pain. Pain may also occur after an extraction (tooth is pulled out). Pain sometimes originates from other areas and radiates to the jaw, thus appearing to be tooth pain.Bacteria growing inside your mouth can contribute to gum disease and dental decay, both of which can cause pain. A toothache occurs from inflammation of the central portion of the tooth called pulp. The pulp contains nerve endings that are very sensitive to pain. Inflammation to the pulp or pulpitis may be caused by dental cavities, trauma, and infection.    HOME CARE:   For toothaches: . Over-the-counter pain medications such as acetaminophen or ibuprofen may be used. Take these as directed on the package while you arrange for a dental appointment. . Avoid very cold or hot foods, because they may make the pain worse. . You may get relief from biting on a cotton ball soaked in oil of cloves. You can get oil of cloves at most drug stores.  For jaw pain: .  Aspirin may be helpful for problems in the joint of the jaw in adults. . If pain happens every time you open your mouth widely, the temporomandibular joint (TMJ) may be the source of the pain. Yawning or taking a large bite of  food may worsen the pain. An appointment with your doctor or dentist will help you find the cause.     GET HELP RIGHT AWAY IF:  . You have a high fever or chills . If you have had a recent head or face injury and develop headache, light headedness, nausea, vomiting, or other symptoms that concern you after an injury to your face or mouth, you could have a more serious injury in addition to your dental injury. . A facial rash associated with a toothache: This condition may improve with medication. Contact your doctor for them to decide what is appropriate. . Any jaw pain occurring with chest pain: Although jaw pain is most commonly caused by dental disease, it is sometimes referred pain from other areas. People with heart disease, especially people who have had stents placed, people with diabetes, or those who have had heart surgery may have jaw pain as a symptom of heart attack or angina. If your jaw or tooth pain is associated with lightheadedness, sweating, or shortness of breath, you should see a doctor as soon as possible. . Trouble swallowing or excessive pain or bleeding from gums: If you have a history of a weakened immune system, diabetes, or steroid use, you may be more susceptible to infections. Infections can often be more severe and extensive or caused by unusual organisms. Dental and gum infections in  people with these conditions may require more aggressive treatment. An abscess may need draining or IV antibiotics, for example.  MAKE SURE YOU    Understand these instructions.  Will watch your condition.  Will get help right away if you are not doing well or get worse.  Thank you for choosing an e-visit. Your e-visit answers were reviewed by a board certified advanced clinical practitioner to complete your personal care plan. Depending upon the condition, your plan could have included both over the counter or prescription medications. Please review your pharmacy choice. Make sure  the pharmacy is open so you can pick up prescription now. If there is a problem, you may contact your provider through [employer] and have the prescription routed to another pharmacy. Your safety is important to [place]. If you have drug allergies check your prescription carefully.  For the next 24 hours you can use MyChart to ask questions about today's visit, request a non-urgent call back, or ask for a work or school excuse. You will get an email in the next two days asking about your experience. I hope that your e-visit has been valuable and will speed your recovery.

## 2020-04-08 NOTE — Progress Notes (Signed)
I have spent 5 minutes in review of e-visit questionnaire, review and updating patient chart, medical decision making and response to patient.   Jasean Ambrosia Cody Dorita Rowlands, PA-C    

## 2020-07-04 ENCOUNTER — Telehealth: Payer: Medicaid Other | Admitting: Emergency Medicine

## 2020-07-04 DIAGNOSIS — K047 Periapical abscess without sinus: Secondary | ICD-10-CM

## 2020-07-04 MED ORDER — CLINDAMYCIN HCL 300 MG PO CAPS: 300.0000 mg | ORAL_CAPSULE | Freq: Four times a day (QID) | ORAL | 0 refills | Status: DC

## 2020-07-04 NOTE — Progress Notes (Signed)
E-Visit for Dental Pain  We are sorry that you are not feeling well.  Here is how we plan to help!  Based on what you have shared with me in the questionnaire, it sounds like you have a dental abscess.  Clindamycin 300mg  4 times per day for 7 days  It is imperative that you see a dentist within 10 days of this eVisit to determine the cause of the dental pain and be sure it is adequately treated  A toothache or tooth pain is caused when the nerve in the root of a tooth or surrounding a tooth is irritated. Dental (tooth) infection, decay, injury, or loss of a tooth are the most common causes of dental pain. Pain may also occur after an extraction (tooth is pulled out). Pain sometimes originates from other areas and radiates to the jaw, thus appearing to be tooth pain.Bacteria growing inside your mouth can contribute to gum disease and dental decay, both of which can cause pain. A toothache occurs from inflammation of the central portion of the tooth called pulp. The pulp contains nerve endings that are very sensitive to pain. Inflammation to the pulp or pulpitis may be caused by dental cavities, trauma, and infection.    HOME CARE:   For toothaches: . Over-the-counter pain medications such as acetaminophen or ibuprofen may be used. Take these as directed on the package while you arrange for a dental appointment. . Avoid very cold or hot foods, because they may make the pain worse. . You may get relief from biting on a cotton ball soaked in oil of cloves. You can get oil of cloves at most drug stores.  For jaw pain: .  Aspirin may be helpful for problems in the joint of the jaw in adults. . If pain happens every time you open your mouth widely, the temporomandibular joint (TMJ) may be the source of the pain. Yawning or taking a large bite of food may worsen the pain. An appointment with your doctor or dentist will help you find the cause.     GET HELP RIGHT AWAY IF:  . You have a high fever  or chills . If you have had a recent head or face injury and develop headache, light headedness, nausea, vomiting, or other symptoms that concern you after an injury to your face or mouth, you could have a more serious injury in addition to your dental injury. . A facial rash associated with a toothache: This condition may improve with medication. Contact your doctor for them to decide what is appropriate. . Any jaw pain occurring with chest pain: Although jaw pain is most commonly caused by dental disease, it is sometimes referred pain from other areas. People with heart disease, especially people who have had stents placed, people with diabetes, or those who have had heart surgery may have jaw pain as a symptom of heart attack or angina. If your jaw or tooth pain is associated with lightheadedness, sweating, or shortness of breath, you should see a doctor as soon as possible. . Trouble swallowing or excessive pain or bleeding from gums: If you have a history of a weakened immune system, diabetes, or steroid use, you may be more susceptible to infections. Infections can often be more severe and extensive or caused by unusual organisms. Dental and gum infections in people with these conditions may require more aggressive treatment. An abscess may need draining or IV antibiotics, for example.  MAKE SURE YOU    Understand these instructions.  Will watch your condition.  Will get help right away if you are not doing well or get worse.  Thank you for choosing an e-visit. Your e-visit answers were reviewed by a board certified advanced clinical practitioner to complete your personal care plan. Depending upon the condition, your plan could have included both over the counter or prescription medications. Please review your pharmacy choice. Make sure the pharmacy is open so you can pick up prescription now. If there is a problem, you may contact your provider through [employer] and have the  prescription routed to another pharmacy. Your safety is important to [place]. If you have drug allergies check your prescription carefully.  For the next 24 hours you can use MyChart to ask questions about today's visit, request a non-urgent call back, or ask for a work or school excuse. You will get an email in the next two days asking about your experience. I hope that your e-visit has been valuable and will speed your recovery.  Approximately 5 minutes was spent documenting and reviewing patient's chart.

## 2020-09-24 ENCOUNTER — Telehealth: Payer: Medicaid Other | Admitting: Nurse Practitioner

## 2020-09-24 DIAGNOSIS — K047 Periapical abscess without sinus: Secondary | ICD-10-CM

## 2020-09-24 MED ORDER — NAPROXEN 500 MG PO TABS: 500.0000 mg | ORAL_TABLET | Freq: Two times a day (BID) | ORAL | 0 refills | Status: DC

## 2020-09-24 MED ORDER — CLINDAMYCIN HCL 300 MG PO CAPS: 300.0000 mg | ORAL_CAPSULE | Freq: Four times a day (QID) | ORAL | 0 refills | Status: DC

## 2020-09-24 NOTE — Progress Notes (Signed)
E-Visit for Dental Pain  We are sorry that you are not feeling well.  Here is how we plan to help!  Based on what you have shared with me in the questionnaire, it sounds like you have dental abscess  naprosyn 500mg 2 times per day for 7 days for discomfort and Amoxicillin 500mg 3 times per day for 10 days  It is imperative that you see a dentist within 10 days of this eVisit to determine the cause of the dental pain and be sure it is adequately treated  A toothache or tooth pain is caused when the nerve in the root of a tooth or surrounding a tooth is irritated. Dental (tooth) infection, decay, injury, or loss of a tooth are the most common causes of dental pain. Pain may also occur after an extraction (tooth is pulled out). Pain sometimes originates from other areas and radiates to the jaw, thus appearing to be tooth pain.Bacteria growing inside your mouth can contribute to gum disease and dental decay, both of which can cause pain. A toothache occurs from inflammation of the central portion of the tooth called pulp. The pulp contains nerve endings that are very sensitive to pain. Inflammation to the pulp or pulpitis may be caused by dental cavities, trauma, and infection.    HOME CARE:   For toothaches: Over-the-counter pain medications such as acetaminophen or ibuprofen may be used. Take these as directed on the package while you arrange for a dental appointment. Avoid very cold or hot foods, because they may make the pain worse. You may get relief from biting on a cotton ball soaked in oil of cloves. You can get oil of cloves at most drug stores.  For jaw pain:  Aspirin may be helpful for problems in the joint of the jaw in adults. If pain happens every time you open your mouth widely, the temporomandibular joint (TMJ) may be the source of the pain. Yawning or taking a large bite of food may worsen the pain. An appointment with your doctor or dentist will help you find the cause.      GET HELP RIGHT AWAY IF:  You have a high fever or chills If you have had a recent head or face injury and develop headache, light headedness, nausea, vomiting, or other symptoms that concern you after an injury to your face or mouth, you could have a more serious injury in addition to your dental injury. A facial rash associated with a toothache: This condition may improve with medication. Contact your doctor for them to decide what is appropriate. Any jaw pain occurring with chest pain: Although jaw pain is most commonly caused by dental disease, it is sometimes referred pain from other areas. People with heart disease, especially people who have had stents placed, people with diabetes, or those who have had heart surgery may have jaw pain as a symptom of heart attack or angina. If your jaw or tooth pain is associated with lightheadedness, sweating, or shortness of breath, you should see a doctor as soon as possible. Trouble swallowing or excessive pain or bleeding from gums: If you have a history of a weakened immune system, diabetes, or steroid use, you may be more susceptible to infections. Infections can often be more severe and extensive or caused by unusual organisms. Dental and gum infections in people with these conditions may require more aggressive treatment. An abscess may need draining or IV antibiotics, for example.  MAKE SURE YOU   Understand these instructions.   Will watch your condition. Will get help right away if you are not doing well or get worse.  Thank you for choosing an e-visit.  Your e-visit answers were reviewed by a board certified advanced clinical practitioner to complete your personal care plan. Depending upon the condition, your plan could have included both over the counter or prescription medications.  Please review your pharmacy choice. Make sure the pharmacy is open so you can pick up prescription now. If there is a problem, you may contact your provider  through MyChart messaging and have the prescription routed to another pharmacy.  Your safety is important to us. If you have drug allergies check your prescription carefully.   For the next 24 hours you can use MyChart to ask questions about today's visit, request a non-urgent call back, or ask for a work or school excuse. You will get an email in the next two days asking about your experience. I hope that your e-visit has been valuable and will speed your recovery.  5-10 minutes spent reviewing and documenting in chart.   

## 2020-10-02 ENCOUNTER — Telehealth: Payer: Medicaid Other | Admitting: Nurse Practitioner

## 2020-10-02 DIAGNOSIS — B379 Candidiasis, unspecified: Secondary | ICD-10-CM

## 2020-10-02 MED ORDER — FLUCONAZOLE 150 MG PO TABS: 150.0000 mg | ORAL_TABLET | Freq: Once | ORAL | 0 refills | Status: AC

## 2020-10-02 NOTE — Progress Notes (Signed)
E-Visit for Vaginal Symptoms ° °We are sorry that you are not feeling well. Here is how we plan to help! °Based on what you shared with me it looks like you: May have a yeast vaginosis ° °Vaginosis is an inflammation of the vagina that can result in discharge, itching and pain. The cause is usually a change in the normal balance of vaginal bacteria or an infection. Vaginosis can also result from reduced estrogen levels after menopause. ° °The most common causes of vaginosis are: ° ° Bacterial vaginosis which results from an overgrowth of one on several organisms that are normally present in your vagina. ° ° Yeast infections which are caused by a naturally occurring fungus called candida. ° ° Vaginal atrophy (atrophic vaginosis) which results from the thinning of the vagina from reduced estrogen levels after menopause. ° ° Trichomoniasis which is caused by a parasite and is commonly transmitted by sexual intercourse. ° °Factors that increase your risk of developing vaginosis include: °Medications, such as antibiotics and steroids °Uncontrolled diabetes °Use of hygiene products such as bubble bath, vaginal spray or vaginal deodorant °Douching °Wearing damp or tight-fitting clothing °Using an intrauterine device (IUD) for birth control °Hormonal changes, such as those associated with pregnancy, birth control pills or menopause °Sexual activity °Having a sexually transmitted infection ° °Your treatment plan is A single Diflucan (fluconazole) 150mg tablet once.  I have electronically sent this prescription into the pharmacy that you have chosen. ° °Be sure to take all of the medication as directed. Stop taking any medication if you develop a rash, tongue swelling or shortness of breath. Mothers who are breast feeding should consider pumping and discarding their breast milk while on these antibiotics. However, there is no consensus that infant exposure at these doses would be harmful.  °Remember that medication creams can  weaken latex condoms. °. ° ° °HOME CARE: ° °Good hygiene may prevent some types of vaginosis from recurring and may relieve some symptoms: ° °Avoid baths, hot tubs and whirlpool spas. Rinse soap from your outer genital area after a shower, and dry the area well to prevent irritation. Don't use scented or harsh soaps, such as those with deodorant or antibacterial action. °Avoid irritants. These include scented tampons and pads. °Wipe from front to back after using the toilet. Doing so avoids spreading fecal bacteria to your vagina. ° °Other things that may help prevent vaginosis include: ° °Don't douche. Your vagina doesn't require cleansing other than normal bathing. Repetitive douching disrupts the normal organisms that reside in the vagina and can actually increase your risk of vaginal infection. Douching won't clear up a vaginal infection. °Use a latex condom. Both female and female latex condoms may help you avoid infections spread by sexual contact. °Wear cotton underwear. Also wear pantyhose with a cotton crotch. If you feel comfortable without it, skip wearing underwear to bed. Yeast thrives in moist environments °Your symptoms should improve in the next day or two. ° °GET HELP RIGHT AWAY IF: ° °You have pain in your lower abdomen ( pelvic area or over your ovaries) °You develop nausea or vomiting °You develop a fever °Your discharge changes or worsens °You have persistent pain with intercourse °You develop shortness of breath, a rapid pulse, or you faint. ° °These symptoms could be signs of problems or infections that need to be evaluated by a medical provider now. ° °MAKE SURE YOU  ° °Understand these instructions. °Will watch your condition. °Will get help right away if you are not   doing well or get worse. ° °Thank you for choosing an e-visit. ° °Your e-visit answers were reviewed by a board certified advanced clinical practitioner to complete your personal care plan. Depending upon the condition, your plan  could have included both over the counter or prescription medications. ° °Please review your pharmacy choice. Make sure the pharmacy is open so you can pick up prescription now. If there is a problem, you may contact your provider through MyChart messaging and have the prescription routed to another pharmacy.  Your safety is important to us. If you have drug allergies check your prescription carefully.  ° °For the next 24 hours you can use MyChart to ask questions about today's visit, request a non-urgent call back, or ask for a work or school excuse. °You will get an email in the next two days asking about your experience. I hope that your e-visit has been valuable and will speed your recovery.  ° °Meds ordered this encounter  °Medications  ° fluconazole (DIFLUCAN) 150 MG tablet  °  Sig: Take 1 tablet (150 mg total) by mouth once for 1 dose.  °  Dispense:  1 tablet  °  Refill:  0  °  ° °I spent approximately 7 minutes reviewing the patient's history, current symptoms and coordinating their plan of care today.   °

## 2020-10-14 ENCOUNTER — Telehealth: Payer: Medicaid Other | Admitting: Nurse Practitioner

## 2020-10-14 DIAGNOSIS — L089 Local infection of the skin and subcutaneous tissue, unspecified: Secondary | ICD-10-CM

## 2020-10-14 MED ORDER — DOXYCYCLINE HYCLATE 100 MG PO TABS: 100.0000 mg | ORAL_TABLET | Freq: Two times a day (BID) | ORAL | 0 refills | Status: AC

## 2020-10-14 NOTE — Progress Notes (Signed)
We are sorry that you are experiencing this issue.  Here is how we plan to help!  Based on what you shared with me it looks like you have cystic acne.  Acne is a disorder of the hair follicles and oil glands (sebaceous glands). The sebaceous glands secrete oils to keep the skin moist.  When the glands get clogged, it can lead to pimples or cysts.  These cysts may become infected and leave scars. Acne is very common and normally occurs at puberty.  Acne is also inherited.  Your personal care plan consists of the following recommendations:  I recommend that you use a daily cleanser  You might try an over the counter cleanser that has benzoyl peroxide.  I recommend that you start with a product that has 2.5% benzoyl peroxide.  Stronger concentrations have not been shown to be more effective.   I have also prescribed one of the following additional therapies:  Doxycycline an oral antibiotic 100 mg twice a day  If excessive dryness or peeling occurs, reduce dose frequency or concentration of the topical scrubs.  If excessive stinging or burning occurs, remove the topical gel with mild soap and water and resume at a lower dose the next day.  Remember oral antibiotics and topical acne treatments may increase your sensitivity to the sun!  HOME CARE: Do not squeeze pimples because that can often lead to infections, worse acne, and scars. Use a moisturizer that contains retinoid or fruit acids that may inhibit the development of new acne lesions. Although there is not a clear link that foods can cause acne, doctors do believe that too many sweets predispose you to skin problems.  GET HELP RIGHT AWAY IF: If your acne gets worse or is not better within 10 days. If you become depressed. If you become pregnant, discontinue medications and call your OB/GYN.  MAKE SURE YOU: Understand these instructions. Will watch your condition. Will get help right away if you are not doing well or get  worse.  Thank you for choosing an e-visit.  Your e-visit answers were reviewed by a board certified advanced clinical practitioner to complete your personal care plan. Depending upon the condition, your plan could have included both over the counter or prescription medications.  Please review your pharmacy choice. Make sure the pharmacy is open so you can pick up prescription now. If there is a problem, you may contact your provider through [employer] and have the prescription routed to another pharmacy.  Your safety is important to [place]. If you have drug allergies check your prescription carefully.   For the next 24 hours you can use MyChart to ask questions about today's visit, request a non-urgent call back, or ask for a work or school excuse. You will get an email in the next two days asking about your experience. I hope that your e-visit has been valuable and will speed your recovery.   Meds ordered this encounter  Medications   doxycycline (VIBRA-TABS) 100 MG tablet    Sig: Take 1 tablet (100 mg total) by mouth 2 (two) times daily for 10 days.    Dispense:  20 tablet    Refill:  0     I spent approximately 7 minutes reviewing the patient's history, current symptoms and coordinating their plan of care today.

## 2020-10-16 ENCOUNTER — Emergency Department
Admission: EM | Admit: 2020-10-16 | Discharge: 2020-10-16 | Disposition: A | Discharge status: Home / Self Care | Payer: Medicaid Other | Attending: Emergency Medicine | Admitting: Emergency Medicine

## 2020-10-16 ENCOUNTER — Encounter: Payer: Self-pay | Admitting: Emergency Medicine

## 2020-10-16 ENCOUNTER — Other Ambulatory Visit: Payer: Self-pay

## 2020-10-16 DIAGNOSIS — R1033 Periumbilical pain: Secondary | ICD-10-CM | POA: Insufficient documentation

## 2020-10-16 DIAGNOSIS — Z5321 Procedure and treatment not carried out due to patient leaving prior to being seen by health care provider: Secondary | ICD-10-CM | POA: Insufficient documentation

## 2020-10-16 DIAGNOSIS — R112 Nausea with vomiting, unspecified: Secondary | ICD-10-CM | POA: Insufficient documentation

## 2020-10-16 LAB — COMPREHENSIVE METABOLIC PANEL
ALT: 11 U/L (ref 0–44)
AST: 14 U/L — ABNORMAL LOW (ref 15–41)
Albumin: 3.8 g/dL (ref 3.5–5.0)
Alkaline Phosphatase: 76 U/L (ref 38–126)
Anion gap: 11 (ref 5–15)
BUN: 8 mg/dL (ref 6–20)
CO2: 22 mmol/L (ref 22–32)
Calcium: 9.1 mg/dL (ref 8.9–10.3)
Chloride: 100 mmol/L (ref 98–111)
Creatinine, Ser: 0.62 mg/dL (ref 0.44–1.00)
GFR, Estimated: >60 mL/min (ref >60)
Glucose, Bld: 96 mg/dL (ref 70–99)
Potassium: 3.6 mmol/L (ref 3.5–5.1)
Sodium: 133 mmol/L — ABNORMAL LOW (ref 135–145)
Total Bilirubin: 0.8 mg/dL (ref 0.3–1.2)
Total Protein: 8.7 g/dL — ABNORMAL HIGH (ref 6.5–8.1)

## 2020-10-16 LAB — CBC
HCT: 48.9 % — ABNORMAL HIGH (ref 36.0–46.0)
Hemoglobin: 16.9 g/dL — ABNORMAL HIGH (ref 12.0–15.0)
MCH: 30.2 pg (ref 26.0–34.0)
MCHC: 34.6 g/dL (ref 30.0–36.0)
MCV: 87.3 fL (ref 80.0–100.0)
Platelets: 295 10*3/uL (ref 150–400)
RBC: 5.6 MIL/uL — ABNORMAL HIGH (ref 3.87–5.11)
RDW: 14.1 % (ref 11.5–15.5)
WBC: 8.6 10*3/uL (ref 4.0–10.5)
nRBC: 0 % (ref 0.0–0.2)

## 2020-10-16 LAB — LIPASE, BLOOD: Lipase: 21 U/L (ref 11–51)

## 2020-10-16 NOTE — ED Triage Notes (Signed)
Pt comes into the ED via POV c/o pain behind the umbilicus as well as nausea and vomiting.  Pt states the pain started yesterday.  Pt currentl ambulatory to triage and in NAD.

## 2020-12-02 ENCOUNTER — Telehealth: Payer: Medicaid Other | Admitting: Physician Assistant

## 2020-12-02 DIAGNOSIS — L0100 Impetigo, unspecified: Secondary | ICD-10-CM

## 2020-12-02 MED ORDER — DOXYCYCLINE HYCLATE 100 MG PO TABS: 100.0000 mg | ORAL_TABLET | Freq: Two times a day (BID) | ORAL | 0 refills | Status: DC

## 2020-12-02 NOTE — Progress Notes (Signed)
E Visit for Rash  We are sorry that you are not feeling well. Here is how we plan to help!  Based on the appearance of the rash it appears you may have impetigo. This is a bacterial infection. I will prescribe doxycycline 100mg  Take 1 tablet twice daily for 7 days.  HOME CARE:  Take cool showers and avoid direct sunlight. Apply cool compress or wet dressings. Take a bath in an oatmeal bath.  Sprinkle content of one Aveeno packet under running faucet with comfortably warm water.  Bathe for 15-20 minutes, 1-2 times daily.  Pat dry with a towel. Do not rub the rash. Use hydrocortisone cream. Take an antihistamine like Benadryl for widespread rashes that itch.  The adult dose of Benadryl is 25-50 mg by mouth 4 times daily. Caution:  This type of medication may cause sleepiness.  Do not drink alcohol, drive, or operate dangerous machinery while taking antihistamines.  Do not take these medications if you have prostate enlargement.  Read package instructions thoroughly on all medications that you take.  GET HELP RIGHT AWAY IF:  Symptoms don't go away after treatment. Severe itching that persists. If you rash spreads or swells. If you rash begins to smell. If it blisters and opens or develops a yellow-brown crust. You develop a fever. You have a sore throat. You become short of breath.  MAKE SURE YOU:  Understand these instructions. Will watch your condition. Will get help right away if you are not doing well or get worse.  Thank you for choosing an e-visit.  Your e-visit answers were reviewed by a board certified advanced clinical practitioner to complete your personal care plan. Depending upon the condition, your plan could have included both over the counter or prescription medications.  Please review your pharmacy choice. Make sure the pharmacy is open so you can pick up prescription now. If there is a problem, you may contact your provider through and have the  prescription routed to another pharmacy.  Your safety is important to [employer]. If you have drug allergies check your prescription carefully.   For the next 24 hours you can use MyChart to ask questions about today's visit, request a non-urgent call back, or ask for a work or school excuse. You will get an email in the next two days asking about your experience. I hope that your e-visit has been valuable and will speed your recovery.   I provided 5 minutes of non face-to-face time during this encounter for chart review and documentation.

## 2020-12-11 ENCOUNTER — Telehealth: Payer: Medicaid Other | Admitting: Nurse Practitioner

## 2020-12-11 DIAGNOSIS — L089 Local infection of the skin and subcutaneous tissue, unspecified: Secondary | ICD-10-CM

## 2020-12-11 NOTE — Progress Notes (Signed)
Based on what you shared with me, I feel your condition warrants further evaluation and I recommend that you be seen for a face to face visit.    AS YOU WERE RECENTLY TREATED FOR IMPETIGO 9 DAYS AGO WITH A 7 DAY COURSE OF ANTIBIOTICS AND YOU STILL HAVE A RASH I WOULD RECOMMEND YOU BEEN SEEN IN PERSON IN URGENT CARE   Please contact your primary care physician practice to be seen. Many offices offer virtual options to be seen via video if you are not comfortable going in person to a medical facility at this time.  NOTE: You will NOT be charged for this eVisit.  If you do not have a PCP, [facility] offers a free physician referral service available at [phone]. Our trained staff has the experience, knowledge and resources to put you in touch with a physician who is right for you.    If you are having a true medical emergency please call 911.   Your e-visit answers were reviewed by a board certified advanced clinical practitioner to complete your personal care plan.  Thank you for using e-Visits.

## 2020-12-11 NOTE — Progress Notes (Signed)
I have spent 5 minutes in review of e-visit questionnaire, review and updating patient chart, medical decision making and response to patient.  ° °Rasa Degrazia W Shenaya Lebo, NP ° °  °

## 2020-12-19 ENCOUNTER — Telehealth: Payer: Medicaid Other | Admitting: Family Medicine

## 2020-12-19 ENCOUNTER — Other Ambulatory Visit: Payer: Self-pay | Admitting: Physician Assistant

## 2020-12-19 DIAGNOSIS — B369 Superficial mycosis, unspecified: Secondary | ICD-10-CM

## 2020-12-19 DIAGNOSIS — L0100 Impetigo, unspecified: Secondary | ICD-10-CM

## 2020-12-19 MED ORDER — KETOCONAZOLE 200 MG PO TABS: 200.0000 mg | ORAL_TABLET | Freq: Every day | ORAL | 0 refills | Status: AC

## 2020-12-19 NOTE — Progress Notes (Signed)
E Visit for Rash  We are sorry that you are not feeling well. Here is how we plan to help!  I have ordered you an antifungal medication to take once daily for 10 days. Please clean all your sheets and clothing and towels well and in hot water.   HOME CARE:  Take cool showers and avoid direct sunlight. Apply cool compress or wet dressings. Take a bath in an oatmeal bath.  Sprinkle content of one Aveeno packet under running faucet with comfortably warm water.  Bathe for 15-20 minutes, 1-2 times daily.  Pat dry with a towel. Do not rub the rash. Use hydrocortisone cream. Take an antihistamine like Benadryl for widespread rashes that itch.  The adult dose of Benadryl is 25-50 mg by mouth 4 times daily. Caution:  This type of medication may cause sleepiness.  Do not drink alcohol, drive, or operate dangerous machinery while taking antihistamines.  Do not take these medications if you have prostate enlargement.  Read package instructions thoroughly on all medications that you take.  GET HELP RIGHT AWAY IF:  Symptoms don't go away after treatment. Severe itching that persists. If you rash spreads or swells. If you rash begins to smell. If it blisters and opens or develops a yellow-brown crust. You develop a fever. You have a sore throat. You become short of breath.  MAKE SURE YOU:  Understand these instructions. Will watch your condition. Will get help right away if you are not doing well or get worse.  Thank you for choosing an e-visit.  Your e-visit answers were reviewed by a board certified advanced clinical practitioner to complete your personal care plan. Depending upon the condition, your plan could have included both over the counter or prescription medications.  Please review your pharmacy choice. Make sure the pharmacy is open so you can pick up prescription now. If there is a problem, you may contact your provider through [employer] and have the prescription routed to  another pharmacy.  Your safety is important to [place]. If you have drug allergies check your prescription carefully.   For the next 24 hours you can use MyChart to ask questions about today's visit, request a non-urgent call back, or ask for a work or school excuse. You will get an email in the next two days asking about your experience. I hope that your e-visit has been valuable and will speed your recovery.  I provided 5 minutes of non face-to-face time during this encounter for chart review, medication and order placement, as well as and documentation.

## 2020-12-20 ENCOUNTER — Telehealth: Payer: Medicaid Other | Admitting: Nurse Practitioner

## 2020-12-20 DIAGNOSIS — B9689 Other specified bacterial agents as the cause of diseases classified elsewhere: Secondary | ICD-10-CM

## 2020-12-20 DIAGNOSIS — N760 Acute vaginitis: Secondary | ICD-10-CM

## 2020-12-20 MED ORDER — METRONIDAZOLE 500 MG PO TABS: 500.0000 mg | ORAL_TABLET | Freq: Two times a day (BID) | ORAL | 0 refills | Status: AC

## 2020-12-20 NOTE — Progress Notes (Signed)
I have spent 5 minutes in review of e-visit questionnaire, review and updating patient chart, medical decision making and response to patient.  ° °Cissy Galbreath W Melaysia Streed, NP ° °  °

## 2020-12-20 NOTE — Progress Notes (Signed)
E-Visit for Vaginal Symptoms  We are sorry that you are not feeling well. Here is how we plan to help! Based on what you shared with me it looks like you: May have a vaginosis due to bacteria  YOU WILL NEED TO BE EVALUATED AT THE HEALTH DEPARTMENT FOR SYPHILLIS IF YOU DO NOT HAVE A PCP   Vaginosis is an inflammation of the vagina that can result in discharge, itching and pain. The cause is usually a change in the normal balance of vaginal bacteria or an infection. Vaginosis can also result from reduced estrogen levels after menopause.  The most common causes of vaginosis are:   Bacterial vaginosis which results from an overgrowth of one on several organisms that are normally present in your vagina.   Yeast infections which are caused by a naturally occurring fungus called candida.   Vaginal atrophy (atrophic vaginosis) which results from the thinning of the vagina from reduced estrogen levels after menopause.   Trichomoniasis which is caused by a parasite and is commonly transmitted by sexual intercourse.  Factors that increase your risk of developing vaginosis include: Medications, such as antibiotics and steroids Uncontrolled diabetes Use of hygiene products such as bubble bath, vaginal spray or vaginal deodorant Douching Wearing damp or tight-fitting clothing Using an intrauterine device (IUD) for birth control Hormonal changes, such as those associated with pregnancy, birth control pills or menopause Sexual activity Having a sexually transmitted infection  Your treatment plan is Metronidazole or Flagyl 500mg  twice a day for 7 days.  I have electronically sent this prescription into the pharmacy that you have chosen.  Be sure to take all of the medication as directed. Stop taking any medication if you develop a rash, tongue swelling or shortness of breath. Mothers who are breast feeding should consider pumping and discarding their breast milk while on these antibiotics. However,  there is no consensus that infant exposure at these doses would be harmful.  Remember that medication creams can weaken latex condoms.   HOME CARE:  Good hygiene may prevent some types of vaginosis from recurring and may relieve some symptoms:  Avoid baths, hot tubs and whirlpool spas. Rinse soap from your outer genital area after a shower, and dry the area well to prevent irritation. Don't use scented or harsh soaps, such as those with deodorant or antibacterial action. Avoid irritants. These include scented tampons and pads. Wipe from front to back after using the toilet. Doing so avoids spreading fecal bacteria to your vagina.  Other things that may help prevent vaginosis include:  Don't douche. Your vagina doesn't require cleansing other than normal bathing. Repetitive douching disrupts the normal organisms that reside in the vagina and can actually increase your risk of vaginal infection. Douching won't clear up a vaginal infection. Use a latex condom. Both female and female latex condoms may help you avoid infections spread by sexual contact. Wear cotton underwear. Also wear pantyhose with a cotton crotch. If you feel comfortable without it, skip wearing underwear to bed. Yeast thrives in [place] Your symptoms should improve in the next day or two.  GET HELP RIGHT AWAY IF:  You have pain in your lower abdomen ( pelvic area or over your ovaries) You develop nausea or vomiting You develop a fever Your discharge changes or worsens You have persistent pain with intercourse You develop shortness of breath, a rapid pulse, or you faint.  These symptoms could be signs of problems or infections that need to be evaluated by  a medical provider now.  MAKE SURE YOU   Understand these instructions. Will watch your condition. Will get help right away if you are not doing well or get worse.  Thank you for choosing an e-visit.  Your e-visit answers were reviewed by a board  certified advanced clinical practitioner to complete your personal care plan. Depending upon the condition, your plan could have included both over the counter or prescription medications.  Please review your pharmacy choice. Make sure the pharmacy is open so you can pick up prescription now. If there is a problem, you may contact your provider through [employer] and have the prescription routed to another pharmacy.  Your safety is important to [place]. If you have drug allergies check your prescription carefully.   For the next 24 hours you can use MyChart to ask questions about today's visit, request a non-urgent call back, or ask for a work or school excuse. You will get an email in the next two days asking about your experience. I hope that your e-visit has been valuable and will speed your recovery.

## 2020-12-21 ENCOUNTER — Emergency Department
Admission: EM | Admit: 2020-12-21 | Discharge: 2020-12-21 | Disposition: A | Discharge status: Home / Self Care | Payer: Medicaid Other | Attending: Student in an Organized Health Care Education/Training Program | Admitting: Student in an Organized Health Care Education/Training Program

## 2020-12-21 ENCOUNTER — Encounter: Payer: Self-pay | Admitting: Emergency Medicine

## 2020-12-21 ENCOUNTER — Other Ambulatory Visit: Payer: Self-pay

## 2020-12-21 DIAGNOSIS — H6501 Acute serous otitis media, right ear: Secondary | ICD-10-CM | POA: Insufficient documentation

## 2020-12-21 DIAGNOSIS — I10 Essential (primary) hypertension: Secondary | ICD-10-CM | POA: Insufficient documentation

## 2020-12-21 DIAGNOSIS — F17210 Nicotine dependence, cigarettes, uncomplicated: Secondary | ICD-10-CM | POA: Insufficient documentation

## 2020-12-21 MED ORDER — FLUCONAZOLE 150 MG PO TABS: 150.0000 mg | ORAL_TABLET | Freq: Every day | ORAL | 0 refills | Status: DC

## 2020-12-21 MED ORDER — AMOXICILLIN 500 MG PO CAPS: 500.0000 mg | ORAL_CAPSULE | Freq: Three times a day (TID) | ORAL | 0 refills | Status: AC

## 2020-12-21 NOTE — ED Notes (Signed)
Pt verbalizes understanding of d/c instructions and follow up. 

## 2020-12-21 NOTE — ED Provider Notes (Signed)
[facility] Emergency Department Provider Note    Event Date/Time   First MD Initiated Contact with Patient 12/21/20 1146     (approximate)  I have reviewed the triage vital signs and the nursing notes.   HISTORY Chief Complaint Ear Fullness    HPI Robin Carroll is a 25 y.o. female no significant past medical history presents to the ER for evaluation of less than 24 hours of right ear fullness and pain.  States she woke up this morning felt like she was having trouble hearing out of the right ear.  Denies any trauma.  Also noted some swollen lymph nodes.  Denies any sore throat no cough.  When she blows her nose she can feel her right ear popping.  Denies any nausea or vomiting.  Is not interested in being tested for COVID or flu.  Past Medical History:  Diagnosis Date   Hypertension    No family history on file. History reviewed. No pertinent surgical history. 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 Authorizing Provider  amoxicillin (AMOXIL) 500 MG capsule Take 1 capsule (500 mg total) by mouth 3 (three) times daily for 7 days. 12/21/20 12/28/20 Yes Willy Eddy, MD  doxycycline (VIBRA-TABS) 100 MG tablet Take 1 tablet (100 mg total) by mouth 2 (two) times daily. 12/02/20   Margaretann Loveless, PA-C  fluconazole (DIFLUCAN) 150 MG tablet Take 1 tablet (150 mg total) by mouth daily. 12/21/20  Yes Willy Eddy, MD  ketoconazole (NIZORAL) 200 MG tablet Take 1 tablet (200 mg total) by mouth daily for 10 days. 12/19/20 12/29/20  Freddy Finner, NP  metroNIDAZOLE (FLAGYL) 500 MG tablet Take 1 tablet (500 mg total) by mouth 2 (two) times daily for 7 days. 12/20/20 12/27/20  Claiborne Rigg, NP    Allergies Penicillins    Social History Social History   Tobacco Use   Smoking status: Every Day     Packs/day: 0.50    Types: Cigarettes   Smokeless tobacco: Never  Substance Use Topics   Alcohol use: No   Drug use: No    Review of Systems Patient denies headaches, rhinorrhea, blurry vision, numbness, shortness of breath, chest pain, edema, cough, abdominal pain, nausea, vomiting, diarrhea, dysuria, fevers, rashes or hallucinations unless otherwise stated above in HPI. ____________________________________________   PHYSICAL EXAM:  VITAL SIGNS: There were no vitals filed for this visit.  Constitutional: Alert and oriented. Well appearing and in no acute distress. Eyes: Conjunctivae are normal.  Head: Atraumatic. Nose: No congestion/rhinnorhea. Mouth/Throat: Mucous membranes are moist.   Neck: Painless ROM. Right TM dull sunken with effusion, no rupture, no mastoid ttp.  Left tm normal.  Tender left lymphadenopathy Cardiovascular:   Good peripheral circulation. Respiratory: Normal respiratory effort.  No retractions.  Gastrointestinal: Soft and nontender.  Musculoskeletal: No lower extremity tenderness .  No joint effusions. Neurologic:  Normal speech and language. No gross focal neurologic deficits are appreciated.  Skin:  Skin is warm, dry and intact. No rash noted. Psychiatric: Mood and affect are normal. Speech and behavior are normal.  ____________________________________________   LABS (all labs ordered are listed, but only abnormal results are displayed)  No results found for this or any previous visit (from the past 24 hour(s)). ____________________________________________ ____________________________________________  RADIOLOGY   ____________________________________________   PROCEDURES  Procedure(s)  performed:  Procedures    Critical Care performed: no ____________________________________________   INITIAL IMPRESSION / ASSESSMENT AND PLAN / ED COURSE  Pertinent labs & imaging results that were available during my care of the patient were  reviewed by me and considered in my medical decision making (see chart for details).   DDX: aom, uri, strep, aoe, mastoididitis, lymphadenitis,   Robin Carroll is a 25 y.o. who presents to the ED with presentation as described above.  Exam is consistent with acute otitis media.  She is afebrile hemodynamically stable nontoxic-appearing.  Will be placed on oral antibiotics.  States she has been able to tolerate amoxicillin despite documented penicillin allergy.  Is also tolerated Augmentin.  Will be placed on oral antibiotics.  Discussed outpatient follow-up conservative management and signs symptoms which she should return to the ER.  Patient demonstrates understanding and agreeable to plan.   The patient was evaluated in Emergency Department today for the symptoms described in the history of present illness. He/she was evaluated in the context of the global COVID-19 pandemic, which necessitated consideration that the patient might be at risk for infection with the SARS-CoV-2 virus that causes COVID-19. Institutional protocols and algorithms that pertain to the evaluation of patients at risk for COVID-19 are in a state of rapid change based on information released by regulatory bodies including the CDC and federal and state organizations. These policies and algorithms were followed during the patient's care in the ED.    ____________________________________________   FINAL CLINICAL IMPRESSION(S) / ED DIAGNOSES  Final diagnoses:  Non-recurrent acute serous otitis media of right ear      NEW MEDICATIONS STARTED DURING THIS VISIT:  New Prescriptions   AMOXICILLIN (AMOXIL) 500 MG CAPSULE    Take 1 capsule (500 mg total) by mouth 3 (three) times daily for 7 days.   FLUCONAZOLE (DIFLUCAN) 150 MG TABLET    Take 1 tablet (150 mg total) by mouth daily.     Note:  This document was prepared using Dragon voice recognition software and may include unintentional dictation errors.     Willy Eddy, MD 12/21/20 1155

## 2020-12-21 NOTE — ED Triage Notes (Signed)
Pt reports woke up this ama nd could not hear out of her right ear, denies pain, states it feels full

## 2020-12-22 ENCOUNTER — Other Ambulatory Visit: Payer: Self-pay | Admitting: Physician Assistant

## 2020-12-22 ENCOUNTER — Telehealth: Payer: Self-pay | Admitting: Family Medicine

## 2020-12-22 DIAGNOSIS — B3731 Acute candidiasis of vulva and vagina: Secondary | ICD-10-CM

## 2020-12-22 DIAGNOSIS — L0100 Impetigo, unspecified: Secondary | ICD-10-CM

## 2020-12-22 NOTE — Progress Notes (Signed)

## 2020-12-31 ENCOUNTER — Ambulatory Visit: Payer: Self-pay

## 2021-01-11 ENCOUNTER — Telehealth: Payer: Self-pay | Admitting: Nurse Practitioner

## 2021-01-11 DIAGNOSIS — R059 Cough, unspecified: Secondary | ICD-10-CM

## 2021-01-11 MED ORDER — BENZONATATE 100 MG PO CAPS: 100.0000 mg | ORAL_CAPSULE | Freq: Three times a day (TID) | ORAL | 0 refills | Status: AC | PRN

## 2021-01-11 NOTE — Progress Notes (Signed)
We are sorry that you are not feeling well.  Here is how we plan to help!  Based on your presentation I believe you most likely have A cough due to a virus.  This is called viral bronchitis and is best treated by rest, plenty of fluids and control of the cough.  You may use Ibuprofen or Tylenol as directed to help your symptoms.     In addition you may use A prescription cough medication called Tessalon Perles 100mg. You may take 1-2 capsules every 8 hours as needed for your cough.  From your responses in the eVisit questionnaire you describe inflammation in the upper respiratory tract which is causing a significant cough.  This is commonly called Bronchitis and has four common causes:   Allergies Viral Infections Acid Reflux Bacterial Infection Allergies, viruses and acid reflux are treated by controlling symptoms or eliminating the cause. An example might be a cough caused by taking certain blood pressure medications. You stop the cough by changing the medication. Another example might be a cough caused by acid reflux. Controlling the reflux helps control the cough.  USE OF BRONCHODILATOR ("RESCUE") INHALERS: There is a risk from using your bronchodilator too frequently.  The risk is that over-reliance on a medication which only relaxes the muscles surrounding the breathing tubes can reduce the effectiveness of medications prescribed to reduce swelling and congestion of the tubes themselves.  Although you feel brief relief from the bronchodilator inhaler, your asthma may actually be worsening with the tubes becoming more swollen and filled with mucus.  This can delay other crucial treatments, such as oral steroid medications. If you need to use a bronchodilator inhaler daily, several times per day, you should discuss this with your provider.  There are probably better treatments that could be used to keep your asthma under control.     HOME CARE Only take medications as instructed by your medical  team. Complete the entire course of an antibiotic. Drink plenty of fluids and get plenty of rest. Avoid close contacts especially the very young and the elderly Cover your mouth if you cough or cough into your sleeve. Always remember to wash your hands A steam or ultrasonic humidifier can help congestion.   GET HELP RIGHT AWAY IF: You develop worsening fever. You become short of breath You cough up blood. Your symptoms persist after you have completed your treatment plan MAKE SURE YOU  Understand these instructions. Will watch your condition. Will get help right away if you are not doing well or get worse.    Thank you for choosing an e-visit.  Your e-visit answers were reviewed by a board certified advanced clinical practitioner to complete your personal care plan. Depending upon the condition, your plan could have included both over the counter or prescription medications.  Please review your pharmacy choice. Make sure the pharmacy is open so you can pick up prescription now. If there is a problem, you may contact your provider through MyChart messaging and have the prescription routed to another pharmacy.  Your safety is important to us. If you have drug allergies check your prescription carefully.   For the next 24 hours you can use MyChart to ask questions about today's visit, request a non-urgent call back, or ask for a work or school excuse. You will get an email in the next two days asking about your experience. I hope that your e-visit has been valuable and will speed your recovery.   I have spent at   least 5 minutes reviewing and documenting in the patient's chart.  

## 2021-02-04 ENCOUNTER — Encounter: Payer: Self-pay | Admitting: Obstetrics and Gynecology

## 2021-02-17 ENCOUNTER — Ambulatory Visit: Payer: Self-pay | Admitting: Family Medicine

## 2021-02-17 ENCOUNTER — Encounter: Payer: Self-pay | Admitting: Family Medicine

## 2021-02-17 ENCOUNTER — Telehealth: Payer: Self-pay

## 2021-02-17 ENCOUNTER — Other Ambulatory Visit: Payer: Self-pay

## 2021-02-17 DIAGNOSIS — N760 Acute vaginitis: Secondary | ICD-10-CM

## 2021-02-17 DIAGNOSIS — A539 Syphilis, unspecified: Secondary | ICD-10-CM

## 2021-02-17 DIAGNOSIS — Z113 Encounter for screening for infections with a predominantly sexual mode of transmission: Secondary | ICD-10-CM

## 2021-02-17 DIAGNOSIS — B9689 Other specified bacterial agents as the cause of diseases classified elsewhere: Secondary | ICD-10-CM

## 2021-02-17 LAB — WET PREP FOR TRICH, YEAST, CLUE
Trichomonas Exam: NEGATIVE
Yeast Exam: NEGATIVE

## 2021-02-17 LAB — PREGNANCY, URINE: Preg Test, Ur: NEGATIVE

## 2021-02-17 LAB — HEPATITIS B SURFACE ANTIGEN: Hepatitis B Surface Ag: NONREACTIVE

## 2021-02-17 LAB — HM HEPATITIS C SCREENING LAB: HM Hepatitis Screen: NEGATIVE

## 2021-02-17 LAB — HM HIV SCREENING LAB: HM HIV Screening: NEGATIVE

## 2021-02-17 MED ORDER — METRONIDAZOLE 500 MG PO TABS
500.0000 mg | ORAL_TABLET | Freq: Two times a day (BID) | ORAL | 0 refills | Status: AC
Start: 2021-02-17 — End: 2021-02-24

## 2021-02-17 MED ORDER — PENICILLIN G BENZATHINE 1200000 UNIT/2ML IM SUSY
2.4000 10*6.[IU] | PREFILLED_SYRINGE | Freq: Once | INTRAMUSCULAR | Status: AC
  Administered 2021-02-17: 2.4 10*6.[IU] via INTRAMUSCULAR

## 2021-02-17 NOTE — Progress Notes (Signed)
[facility] Department  STI clinic/screening visit [address] [place] [address] [phone]  Subjective:  Robin Carroll is a 26 y.o. female being seen today for an STI screening visit. The patient reports they do not have symptoms.  Patient reports that they do not desire a pregnancy in the next year.   They reported they are not interested in discussing contraception today.    Patient's last menstrual period was 01/10/2021 (approximate).   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    Last HIV test per patient/review of record was a ~ a week ago.  Patient reports last pap was ~ 3 years ago.   Screening for MPX risk: Does the patient have an unexplained rash? No Is the patient MSM? No Does the patient endorse multiple sex partners or anonymous sex partners? No Did the patient have close or sexual contact with a person diagnosed with MPX? No Has the patient traveled outside the [place] where MPX is endemic? No Is there a high clinical suspicion for MPX-- evidenced by one of the following No  -Unlikely to be chickenpox  -Lymphadenopathy  -Rash that present in same phase of evolution on any given body part See flowsheet for further details and programmatic requirements.    The following portions of the patient's history were reviewed and updated as appropriate: allergies, current medications, past medical history, past social history, past surgical history and problem list.  Objective:  There were no vitals filed for this visit.  Physical Exam Vitals and nursing note reviewed.  Constitutional:      Appearance: Normal appearance.   HENT:     Head: Normocephalic and atraumatic.     Mouth/Throat:     Mouth: Mucous membranes are moist.     Pharynx: Oropharynx is clear. No oropharyngeal exudate or posterior oropharyngeal erythema.  Pulmonary:     Effort: Pulmonary effort is normal.  Abdominal:     General: Abdomen is flat.     Palpations: There is no mass.     Tenderness: There is no abdominal tenderness. There is no rebound.  Genitourinary:    General: Normal vulva.     Exam position: Lithotomy position.     Pubic Area: No rash or pubic lice.      Labia:        Right: No rash or lesion.        Left: No rash or lesion.      Vagina: Normal. No vaginal discharge, erythema, bleeding or lesions.     Cervix: No cervical motion tenderness, discharge, friability, lesion or erythema.     Uterus: Normal.      Adnexa: Right adnexa normal and left adnexa normal.     Rectum: Normal.     Comments: External genitalia without, lice, nits, erythema, edema , lesions or inguinal adenopathy. Vagina with normal mucosa, discharge, odor and pH >4.  Cervix without visual lesions, uterus firm, mobile, non-tender, no masses, CMT adnexal fullness or tenderness.   Lymphadenopathy:     Head:     Right side of head: No preauricular or posterior auricular adenopathy.  Left side of head: No preauricular or posterior auricular adenopathy.     Cervical: No cervical adenopathy.     Upper Body:     Right upper body: No supraclavicular or axillary adenopathy.     Left upper body: No supraclavicular or axillary adenopathy.     Lower Body: No right inguinal adenopathy. No left inguinal adenopathy.  Skin:    General: Skin is warm and dry.     Findings: No rash.     Comments: Mottled BLE, feet cold to touch.   Neurological:     Mental Status: She is alert and oriented to person, place, and time.  Psychiatric:        Mood and Affect: Mood normal.        Behavior: Behavior normal.     Assessment and Plan:  Robin Carroll is a 26 y.o.  female presenting to the [facility] Department for STI screening  1. Screening examination for venereal disease Patient accepted all screenings including wet prep, oral, vaginal CT/GC and bloodwork for HIV/RPR.  Patient meets criteria for HepB screening? Yes. Ordered? Yes Patient meets criteria for HepC screening? Yes. Ordered? Yes  Wet prep results + clue    Treatment needed for BV  Patient is currently using  no BCM  to prevent pregnancy.  - Chlamydia/Gonorrhea [facility] - HBV Antigen/Antibody [facility] - HIV/HCV [facility] - Syphilis Serology, [facility] - WET PREP FOR TRICH, YEAST, CLUE - Pregnancy, urine  2. 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 Discussed with patient about listed allergy to PCN.  Pt states that she was told by her mother that she had a rash as a child.  Pt was given Amox 12/21/2020 and reports no problems, even with other times that she has been given Amox.  Pt monitored for 30 mins post injection.  Tolerated well.   Pt scheduled to TRC in 7-10 days.  No sex for the 14 days.   Pt verbalized understanding   - penicillin g benzathine (BICILLIN LA) 1200000 UNIT/2ML injection 2.4 Million Units  3. Bacterial vaginosis  - metroNIDAZOLE (FLAGYL) 500 MG tablet; Take 1 tablet (500 mg total) by mouth 2 (two) times daily for 7 days.  Dispense: 14 tablet; Refill: 0   Discussed time line for [facility] results and that patient will be called with positive results and encouraged patient to call if she had not heard in 2 weeks.  Counseled to return or seek care for continued or worsening symptoms Recommended condom use with all sex  Pt referred [facility] for follow up on circulation.    No follow-ups on file.  Future Appointments  Date Time Provider Department Center  02/26/2021  3:20 PM [facility] PROVIDER [facility] None    Wendi Snipes, FNP

## 2021-02-17 NOTE — Telephone Encounter (Signed)
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  Clinic provider notified.

## 2021-02-17 NOTE — Progress Notes (Signed)
Pt here for STD screening and treatment of Syphilis.  Wet mount results reviewed.  Medication dispensed per Provider orders.  Pt given condoms.  Berdie Ogren, RN

## 2021-02-21 NOTE — Progress Notes (Signed)
Chart reviewed by Pharmacist  Suzanne Walker PharmD, Contract Pharmacist at [facility]  

## 2021-02-26 ENCOUNTER — Other Ambulatory Visit: Payer: Self-pay

## 2021-02-26 ENCOUNTER — Ambulatory Visit: Payer: Self-pay | Admitting: Nurse Practitioner

## 2021-02-26 DIAGNOSIS — A539 Syphilis, unspecified: Secondary | ICD-10-CM

## 2021-02-26 MED ORDER — PENICILLIN G BENZATHINE 1200000 UNIT/2ML IM SUSY
2.4000 10*6.[IU] | PREFILLED_SYRINGE | INTRAMUSCULAR | Status: AC
  Administered 2021-03-05: 2.4 10*6.[IU] via INTRAMUSCULAR

## 2021-02-26 NOTE — Progress Notes (Signed)
26 year old female in clinic today for treatment of Syphilis.  Patient was in STD clinic on 02/17/21 for screening of STDs and follow up regarding positive lab for Syphilis.  Patient had confirmatory testing done on 02/17/21.  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  Patient reports no previous or current signs and symptoms Syphilis.  Patient considered positive for Syphilis with unknown duration.  Patient here today to receive second dose, third dose due in 7 days.  Patient to have RPR follow up in 6 months after last dose.  Injection tolerated well.   No sex for the 14 days.  Glenna Fellows, FNP

## 2021-02-27 ENCOUNTER — Encounter: Payer: Self-pay | Admitting: Nurse Practitioner

## 2021-03-05 ENCOUNTER — Ambulatory Visit: Payer: Self-pay

## 2021-03-05 ENCOUNTER — Other Ambulatory Visit: Payer: Self-pay

## 2021-03-05 DIAGNOSIS — A539 Syphilis, unspecified: Secondary | ICD-10-CM

## 2021-03-05 NOTE — Progress Notes (Signed)
In Nurse Clinic for syphilis tx- Bicillin dose #3. Pt denies prob with previous bicillin injections. Bicillin 2.4 mu given IM today to complete order by Onalee Hua, FNP dated 02/26/2021. Tolerated well. Pt stayed for 20 min observation after injection without any issues. RN explained to pt that RPR is due in 6 months (09/02/2021) and reminder given. Questions answered and reports understanding. Jerel Shepherd, RN

## 2021-04-04 ENCOUNTER — Telehealth: Payer: Self-pay | Admitting: Nurse Practitioner

## 2021-04-04 DIAGNOSIS — J301 Allergic rhinitis due to pollen: Secondary | ICD-10-CM

## 2021-04-04 MED ORDER — LORATADINE 10 MG PO TABS: 10.0000 mg | ORAL_TABLET | Freq: Every day | ORAL | 11 refills | Status: DC

## 2021-04-04 MED ORDER — FLUTICASONE PROPIONATE 50 MCG/ACT NA SUSP: 1.0000 | Freq: Every day | NASAL | 0 refills | Status: DC

## 2021-04-04 NOTE — Progress Notes (Signed)
I have spent 5 minutes in review of e-visit questionnaire, review and updating patient chart, medical decision making and response to patient.  ° °Zeshan Sena W Kadrian Partch, NP ° °  °

## 2021-04-04 NOTE — Progress Notes (Signed)
We are sorry that you are not feeling well.  Here is how we plan to help! ? ?Based on your presentation I believe you most likely have A cough due to allergies.  I recommend that you start the an over-the counter-allergy medication such as Claritin 10 mg or Zyrtec 10 mg daily.   ?  ? ? ?From your responses in the eVisit questionnaire you describe inflammation in the upper respiratory tract which is causing a significant cough.  This is commonly called Bronchitis and has four common causes:   ?Allergies ?Viral Infections ?Acid Reflux ?Bacterial Infection ?Allergies, viruses and acid reflux are treated by controlling symptoms or eliminating the cause. An example might be a cough caused by taking certain blood pressure medications. You stop the cough by changing the medication. Another example might be a cough caused by acid reflux. Controlling the reflux helps control the cough. ? ?USE OF BRONCHODILATOR ("RESCUE") INHALERS: ?There is a risk from using your bronchodilator too frequently.  The risk is that over-reliance on a medication which only relaxes the muscles surrounding the breathing tubes can reduce the effectiveness of medications prescribed to reduce swelling and congestion of the tubes themselves.  Although you feel brief relief from the bronchodilator inhaler, your asthma may actually be worsening with the tubes becoming more swollen and filled with mucus.  This can delay other crucial treatments, such as oral steroid medications. If you need to use a bronchodilator inhaler daily, several times per day, you should discuss this with your provider.  There are probably better treatments that could be used to keep your asthma under control.  ?   ?HOME CARE ?Only take medications as instructed by your medical team. ?Complete the entire course of an antibiotic. ?Drink plenty of fluids and get plenty of rest. ?Avoid close contacts especially the very young and the elderly ?Cover your mouth if you cough or cough  into your sleeve. ?Always remember to wash your hands ?A steam or ultrasonic humidifier can help congestion.  ? ?GET HELP RIGHT AWAY IF: ?You develop worsening fever. ?You become short of breath ?You cough up blood. ?Your symptoms persist after you have completed your treatment plan ?MAKE SURE YOU  ?Understand these instructions. ?Will watch your condition. ?Will get help right away if you are not doing well or get worse. ?  ? ?Thank you for choosing an e-visit. ? ?Your e-visit answers were reviewed by a board certified advanced clinical practitioner to complete your personal care plan. Depending upon the condition, your plan could have included both over the counter or prescription medications. ? ?Please review your pharmacy choice. Make sure the pharmacy is open so you can pick up prescription now. If there is a problem, you may contact your provider through [employer] and have the prescription routed to another pharmacy.  Your safety is important to [place]. If you have drug allergies check your prescription carefully.  ? ?For the next 24 hours you can use MyChart to ask questions about today's visit, request a non-urgent call back, or ask for a work or school excuse. ?You will get an email in the next two days asking about your experience. I hope that your e-visit has been valuable and will speed your recovery.  ?

## 2021-05-11 ENCOUNTER — Telehealth: Payer: Self-pay | Admitting: Physician Assistant

## 2021-05-11 ENCOUNTER — Encounter: Payer: Self-pay | Admitting: Physician Assistant

## 2021-05-11 ENCOUNTER — Telehealth: Payer: Self-pay | Admitting: Family Medicine

## 2021-05-11 DIAGNOSIS — R6889 Other general symptoms and signs: Secondary | ICD-10-CM

## 2021-05-11 DIAGNOSIS — R112 Nausea with vomiting, unspecified: Secondary | ICD-10-CM

## 2021-05-11 MED ORDER — ONDANSETRON HCL 4 MG PO TABS: 4.0000 mg | ORAL_TABLET | Freq: Three times a day (TID) | ORAL | 0 refills | Status: DC | PRN

## 2021-05-11 NOTE — Progress Notes (Signed)
E visit for Flu like symptoms ?  ?We are sorry that you are not feeling well.  Here is how we plan to help! ?Based on what you have shared with me it looks like you may have flu-like symptoms that should be watched but do not seem to indicate anti-viral treatment. ? ?Influenza or ?the flu? is   an infection caused by a respiratory virus. The flu virus is highly contagious and persons who did not receive their yearly flu vaccination may ?catch? the flu from close contact. ? ?We have anti-viral medications to treat the viruses that cause this infection. They are not a ?cure? and only shorten the course of the infection. These prescriptions are most effective when they are given within the first 2 days of ?flu? symptoms. Antiviral medication are indicated if you have a high risk of complications from the flu. You should  also consider an antiviral medication if you are in close contact with someone who is at risk. These medications can help patients avoid complications from the flu  but have side effects that you should know. Possible side effects from Tamiflu or oseltamivir include nausea, vomiting, diarrhea, dizziness, headaches, eye redness, sleep problems or other respiratory symptoms. ?You should not take Tamiflu if you have an allergy to oseltamivir or any to the ingredients in Tamiflu. ? ?Based upon your symptoms and potential risk factors I recommend that you follow the flu symptoms recommendation that I have listed below. ? ?Zofran has already been prescribed for your GI symptoms.  ? ?Continue medications over the counter for any other symptomatic management as needed. ? ?ANYONE WHO HAS FLU SYMPTOMS SHOULD: ?Stay home. The flu is highly contagious and going out or to work exposes others! ?Be sure to drink plenty of fluids. Water is fine as well as fruit juices, sodas and electrolyte beverages. You may want to stay away from caffeine or alcohol. If you are nauseated, try taking small sips of liquids. How do you  know if you are getting enough fluid? Your urine should be a pale yellow or almost colorless. ?Get rest. ?Taking a steamy shower or using a humidifier may help nasal congestion and ease sore throat pain. Using a saline nasal spray works much the same way. ?Cough drops, hard candies and sore throat lozenges may ease your cough. ?Line up a caregiver. Have someone check on you regularly. ? ? ?GET HELP RIGHT AWAY IF: ?You cannot keep down liquids or your medications. ?You become short of breath ?Your fell like you are going to pass out or loose consciousness. ?Your symptoms persist after you have completed your treatment plan ?MAKE SURE YOU  ?Understand these instructions. ?Will watch your condition. ?Will get help right away if you are not doing well or get worse. ? ?Your e-visit answers were reviewed by a board certified advanced clinical practitioner to complete your personal care plan.  Depending on the condition, your plan could have included both over the counter or prescription medications. ? ?If there is a problem please reply  once you have received a response from your provider. ? ?Your safety is important to [place].  If you have drug allergies check your prescription carefully.   ? ?You can use MyChart to ask questions about today?s visit, request a non-urgent call back, or ask for a work or school excuse for 24 hours related to this e-Visit. If it has been greater than 24 hours you will need to follow up with your provider, or enter a new  e-Visit to address those concerns. ? ?You will get an e-mail in the next two days asking about your experience.  I hope that your e-visit has been valuable and will speed your recovery. Thank you for using e-visits. ? ? ?I provided 5 minutes of non face-to-face time during this encounter for chart review and documentation.  ? ?

## 2021-05-11 NOTE — Progress Notes (Signed)
duplicate

## 2021-05-11 NOTE — Progress Notes (Signed)
E-Visit for Nausea and Vomiting   We are sorry that you are not feeling well. Here is how we plan to help!  Based on what you have shared with me it looks like you have a Virus that is irritating your GI tract.  Vomiting is the forceful emptying of a portion of the stomach's content through the mouth.  Although nausea and vomiting can make you feel miserable, it's important to remember that these are not diseases, but rather symptoms of an underlying illness.  When we treat short term symptoms, we always caution that any symptoms that persist should be fully evaluated in a medical office.  I have prescribed a medication that will help alleviate your symptoms and allow you to stay hydrated:  Zofran 4 mg 1 tablet every 8 hours as needed for nausea and vomiting  HOME CARE: Drink clear liquids.  This is very important! Dehydration (the lack of fluid) can lead to a serious complication.  Start off with 1 tablespoon every 5 minutes for 8 hours. You may begin eating bland foods after 8 hours without vomiting.  Start with saltine crackers, white bread, rice, mashed potatoes, applesauce. After 48 hours on a bland diet, you may resume a normal diet. Try to go to sleep.  Sleep often empties the stomach and relieves the need to vomit.  GET HELP RIGHT AWAY IF:  Your symptoms do not improve or worsen within 2 days after treatment. You have a fever for over 3 days. You cannot keep down fluids after trying the medication.  MAKE SURE YOU:  Understand these instructions. Will watch your condition. Will get help right away if you are not doing well or get worse.    Thank you for choosing an e-visit.  Your e-visit answers were reviewed by a board certified advanced clinical practitioner to complete your personal care plan. Depending upon the condition, your plan could have included both over the counter or prescription medications.  Please review your pharmacy choice. Make sure the pharmacy is open so  you can pick up prescription now. If there is a problem, you may contact your provider through MyChart messaging and have the prescription routed to another pharmacy.  Your safety is important to us. If you have drug allergies check your prescription carefully.   For the next 24 hours you can use MyChart to ask questions about today's visit, request a non-urgent call back, or ask for a work or school excuse. You will get an email in the next two days asking about your experience. I hope that your e-visit has been valuable and will speed your recovery.  I provided 5 minutes of non face-to-face time during this encounter for chart review, medication and order placement, as well as and documentation.   

## 2021-06-15 ENCOUNTER — Telehealth: Payer: Self-pay | Admitting: Physician Assistant

## 2021-06-15 DIAGNOSIS — R3989 Other symptoms and signs involving the genitourinary system: Secondary | ICD-10-CM

## 2021-06-15 MED ORDER — SULFAMETHOXAZOLE-TRIMETHOPRIM 800-160 MG PO TABS: 1.0000 | ORAL_TABLET | Freq: Two times a day (BID) | ORAL | 0 refills | Status: DC

## 2021-06-15 NOTE — Progress Notes (Signed)

## 2021-06-26 ENCOUNTER — Telehealth: Payer: Self-pay | Admitting: Physician Assistant

## 2021-06-26 DIAGNOSIS — R195 Other fecal abnormalities: Secondary | ICD-10-CM

## 2021-06-26 DIAGNOSIS — R1110 Vomiting, unspecified: Secondary | ICD-10-CM

## 2021-06-26 NOTE — Progress Notes (Signed)
Based on what you shared with me, I feel your condition warrants further evaluation and I recommend that you be seen in a face to face visit. Giving severity of vomiting and black stools, you will need an in-person evaluation ASAP, either at closest Urgent Care or ER. We need to make sure no concern of bleed giving black stools.    NOTE: There will be NO CHARGE for this eVisit   If you are having a true medical emergency please call 911.      For an urgent face to face visit, [place] has six urgent care centers for your convenience:     [facility] Health Urgent Care Center at [facility] Directions [phone] [address] [place], [place] [address]    [facility] Health Urgent Care Center [facility]) Get Driving Directions [phone] [address] [place], [place] [address]  [facility] Health Urgent Care Center [facility]) Get Driving Directions [phone] [address] [place],  [place]  [address]  [facility] Health Urgent Care at [facility] Get Driving Directions [phone] [address] [place], [place] [address]   [facility] Health Urgent Care at [facility] Get Driving Directions  [phone] [address] [place], [place] [address]   [facility] Health Urgent Care at [facility] Directions [phone] [address] [place], [place] [address]  Your MyChart E-visit questionnaire answers were reviewed by a board certified advanced clinical practitioner to complete your personal care plan based on your specific symptoms.  Thank you for using e-Visits.

## 2021-09-08 IMAGING — CT CT ABD-PELV W/ CM
2 of 4 series · 16 of 46 positions shown, 18 images · IV contrast (APPLIED)
Comparison: None.

CLINICAL DATA: Acute nonlocalized abdominal pain. Nausea and
vomiting.

EXAM:
CT ABDOMEN AND PELVIS WITH CONTRAST
TECHNIQUE: Multidetector CT imaging of the abdomen and pelvis was performed
using the standard protocol following bolus administration of
intravenous contrast.
CONTRAST:  100mL OMNIPAQUE IOHEXOL 300 MG/ML  SOLN

[Series 2: routine abd/pel with · axial · 0.96mm/px · z∈[-404,+21]mm · 13 of 93 slices shown, 15 images]
[im 4/93  soft-tissue]
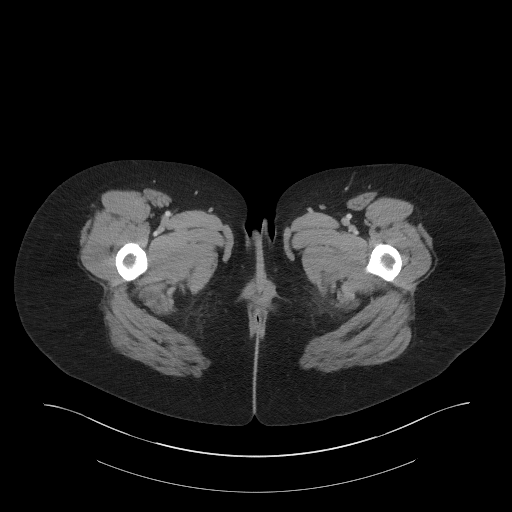
[im 4/93  bone]
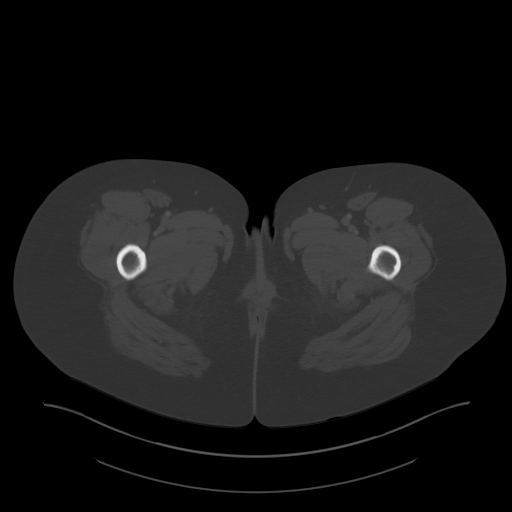
[im 12/93  soft-tissue]
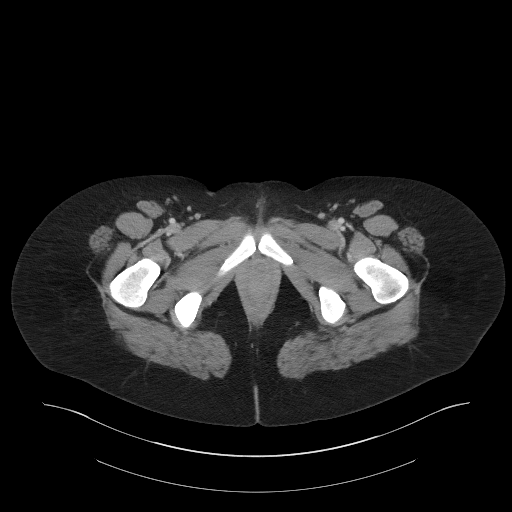
[im 20/93  soft-tissue]
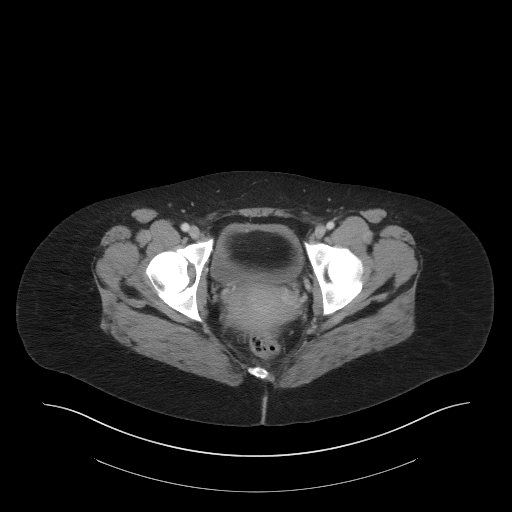
[im 27/93  soft-tissue]
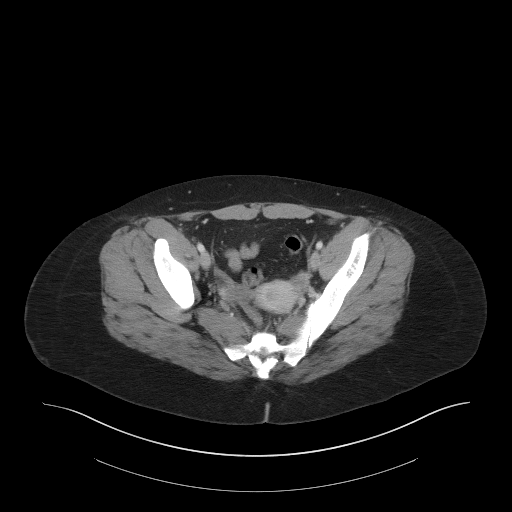
[im 31/93  soft-tissue]
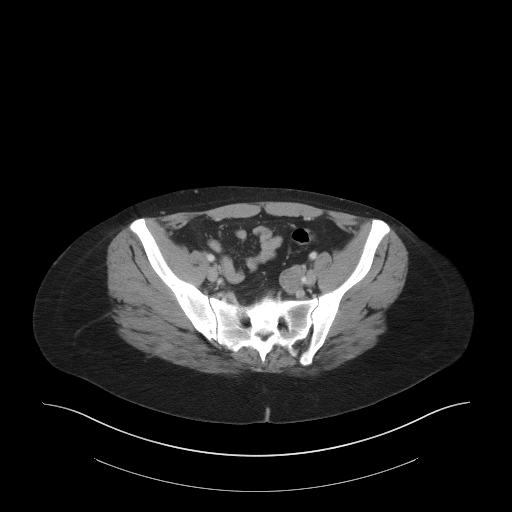
[im 39/93  soft-tissue]
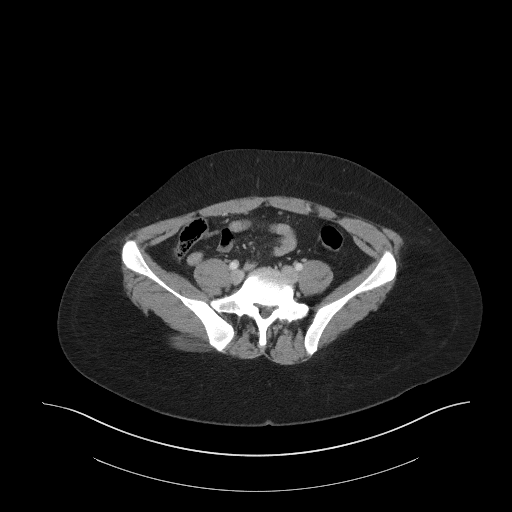
[im 47/93  soft-tissue]
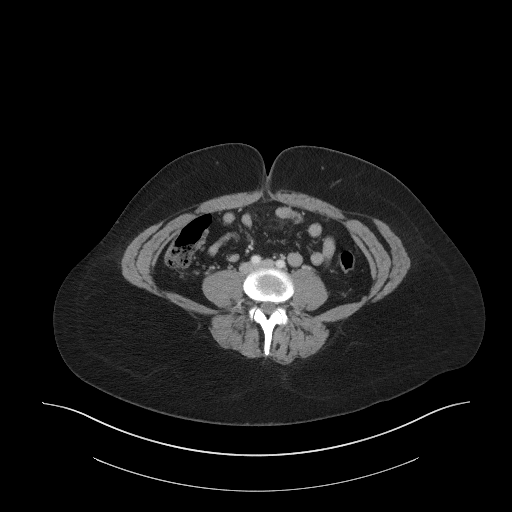
[im 54/93  soft-tissue]
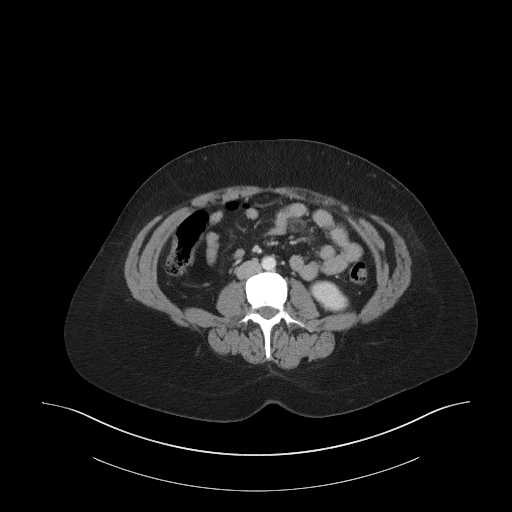
[im 62/93  soft-tissue]
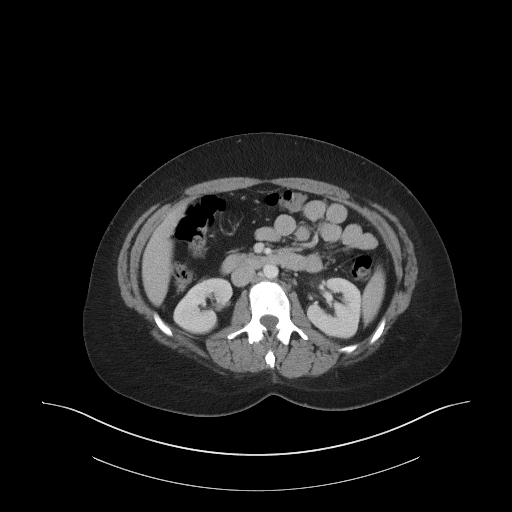
[im 62/93  bone]
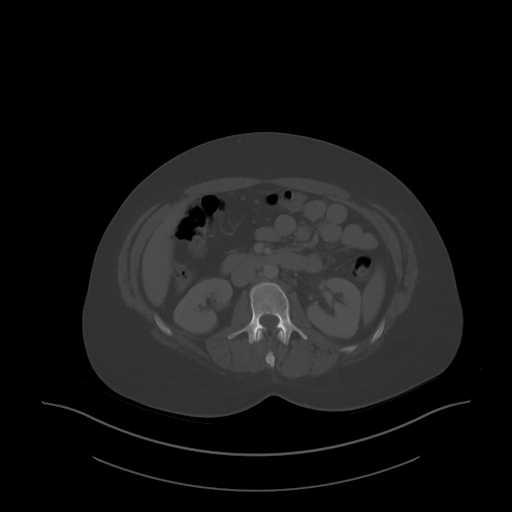
[im 66/93  soft-tissue]
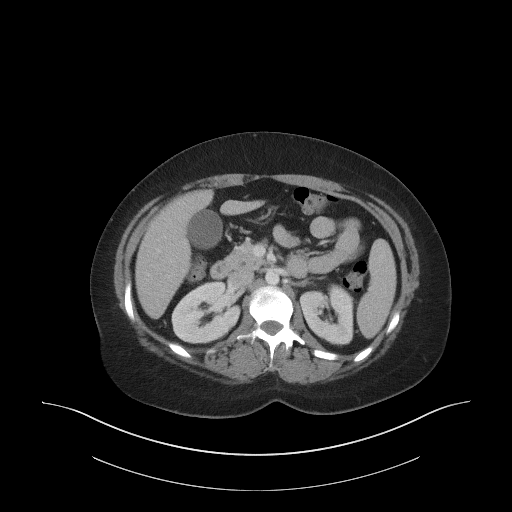
[im 73/93  soft-tissue]
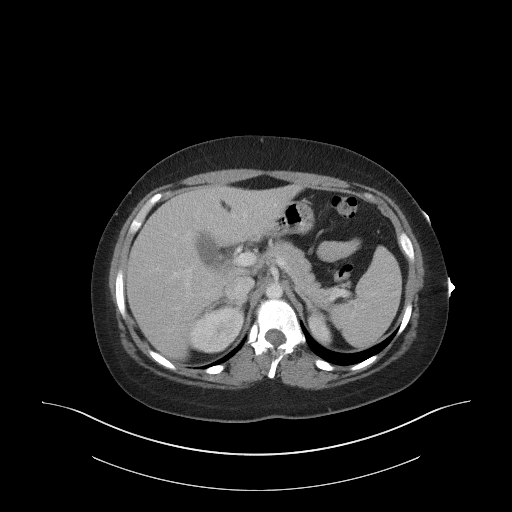
[im 81/93  soft-tissue]
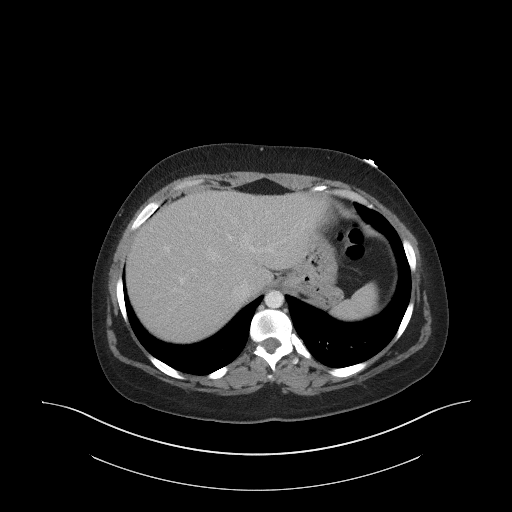
[im 89/93  soft-tissue]
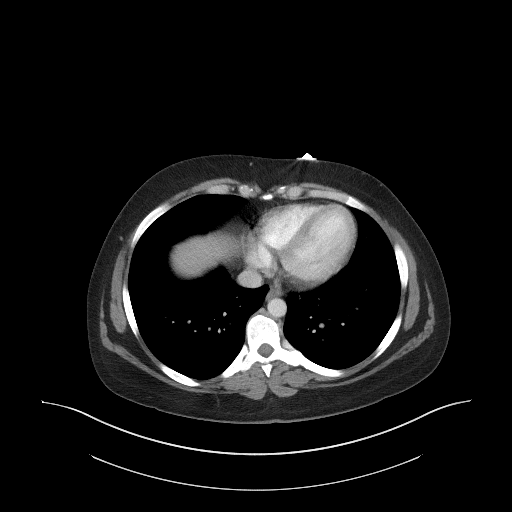

[Series 5: coronal st · coronal · 0.93mm/px · 3 of 91 slices shown]
[im 31/91  soft-tissue]
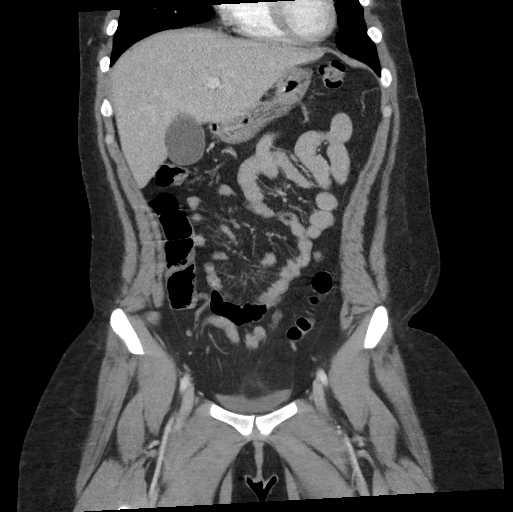
[im 41/91  soft-tissue]
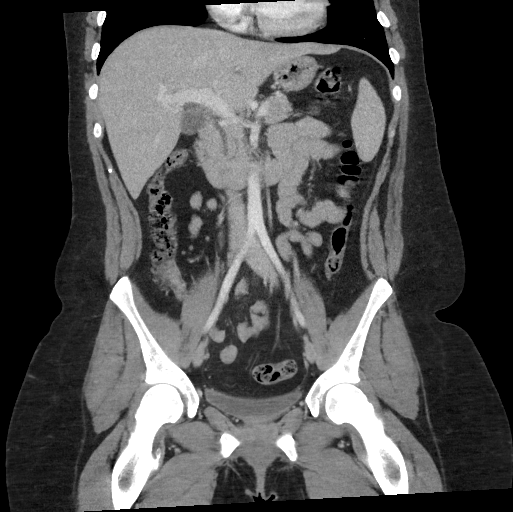
[im 51/91  soft-tissue]
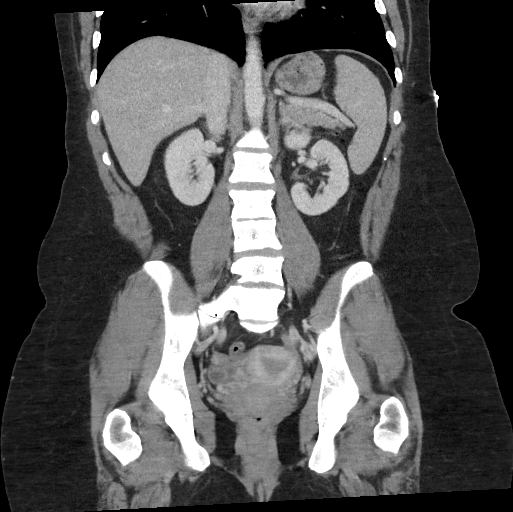

[16 of 46 positions shown; findings below may reference images not displayed]

FINDINGS: Lower chest: Lung bases are clear. No focal airspace disease or
pleural fluid.

Hepatobiliary: No focal liver abnormality is seen. No gallstones,
gallbladder wall thickening, or biliary dilatation.

Pancreas: Unremarkable. No pancreatic ductal dilatation or
surrounding inflammatory changes.

Spleen: Normal in size without focal abnormality.

Adrenals/Urinary Tract: Normal adrenal glands. No hydronephrosis or
perinephric edema. Homogeneous renal enhancement. Urinary bladder is
only minimally distended, grossly unremarkable.

Stomach/Bowel: Decompressed stomach. Normal positioning of the
duodenum and ligament of Treitz. There is no small bowel obstruction
or inflammatory change. Terminal ileum is normal. The appendix is
normal, for example series 2, image 62. Small volume of colonic
stool. No colonic wall thickening or pericolonic edema.

Vascular/Lymphatic: Normal caliber abdominal aorta. Patent portal
vein and mesenteric vessels. No acute vascular findings. No enlarged
lymph nodes in the abdomen or pelvis.

Reproductive: Peripherally enhancing 18 mm cyst in the right ovary
is likely a corpus luteum, physiologic. Small amount of free fluid
in the pelvis and right adnexa. The uterus and left ovary are
normal.

Other: No upper abdominal ascites. No free air or focal fluid
collection. Tiny fat containing umbilical hernia.

Musculoskeletal: Segmentation anomalies in the thoracolumbar spine.
Butterfly vertebra noted in the thoracolumbar junction with focal
scoliosis. Hemi transitional lumbosacral anatomy. Bony fusion of
L2-L3 vertebral bodies. No acute osseous abnormalities are seen.
IMPRESSION: 1. Peripherally enhancing 18 mm cyst in the right ovary is likely a
corpus luteum, physiologic. Small amount of free fluid in the pelvis
and right adnexa.
2. No other acute abnormality in the abdomen/pelvis. Normal
appendix.
3. Congenital segmentation anomalies in the thoracolumbar spine.

## 2022-08-16 ENCOUNTER — Emergency Department: Payer: Medicaid Other

## 2022-08-16 ENCOUNTER — Emergency Department
Admission: EM | Admit: 2022-08-16 | Discharge: 2022-08-16 | Discharge status: Against Medical Advice | Payer: Medicaid Other | Attending: Emergency Medicine | Admitting: Emergency Medicine

## 2022-08-16 DIAGNOSIS — M549 Dorsalgia, unspecified: Secondary | ICD-10-CM | POA: Insufficient documentation

## 2022-08-16 DIAGNOSIS — R0789 Other chest pain: Secondary | ICD-10-CM | POA: Insufficient documentation

## 2022-08-16 DIAGNOSIS — M79602 Pain in left arm: Secondary | ICD-10-CM | POA: Insufficient documentation

## 2022-08-16 DIAGNOSIS — R202 Paresthesia of skin: Secondary | ICD-10-CM | POA: Insufficient documentation

## 2022-08-16 DIAGNOSIS — Z5321 Procedure and treatment not carried out due to patient leaving prior to being seen by health care provider: Secondary | ICD-10-CM | POA: Insufficient documentation

## 2022-08-16 LAB — TROPONIN I (HIGH SENSITIVITY): Troponin I (High Sensitivity): 3 ng/L (ref <18)

## 2022-08-16 LAB — CBC
HCT: 47.8 % — ABNORMAL HIGH (ref 36.0–46.0)
Hemoglobin: 15.7 g/dL — ABNORMAL HIGH (ref 12.0–15.0)
MCH: 30.1 pg (ref 26.0–34.0)
MCHC: 32.8 g/dL (ref 30.0–36.0)
MCV: 91.7 fL (ref 80.0–100.0)
Platelets: 364 10*3/uL (ref 150–400)
RBC: 5.21 MIL/uL — ABNORMAL HIGH (ref 3.87–5.11)
RDW: 15.9 % — ABNORMAL HIGH (ref 11.5–15.5)
WBC: 12.7 10*3/uL — ABNORMAL HIGH (ref 4.0–10.5)
nRBC: 0 % (ref 0.0–0.2)

## 2022-08-16 LAB — BASIC METABOLIC PANEL
Anion gap: 12 (ref 5–15)
BUN: <5 mg/dL — ABNORMAL LOW (ref 6–20)
CO2: 23 mmol/L (ref 22–32)
Calcium: 9.1 mg/dL (ref 8.9–10.3)
Chloride: 102 mmol/L (ref 98–111)
Creatinine, Ser: 0.5 mg/dL (ref 0.44–1.00)
GFR, Estimated: >60 mL/min (ref >60)
Glucose, Bld: 99 mg/dL (ref 70–99)
Potassium: 3 mmol/L — ABNORMAL LOW (ref 3.5–5.1)
Sodium: 137 mmol/L (ref 135–145)

## 2022-08-16 NOTE — ED Triage Notes (Addendum)
Pt arrived POV for CP for the past several days, reports left arm pain, back pain, and tingling sensation to left arm and left leg. Pt was ambulatory to registration desk w/o difficulty. Pt reports feels that her heart is racing, pt is red and splotchy to chest. Denies any drugs or ETOH use tonight. VSS, HR 99, afebrile, NAD noted, A&O x4. Pt reports miscarriage 2 months ago, was seen at outside facility.

## 2023-03-11 ENCOUNTER — Telehealth: Payer: Self-pay | Admitting: Physician Assistant

## 2023-03-11 DIAGNOSIS — L700 Acne vulgaris: Secondary | ICD-10-CM

## 2023-03-11 MED ORDER — CLINDAMYCIN PHOSPHATE 1 % EX GEL: Freq: Two times a day (BID) | CUTANEOUS | 0 refills | Status: DC

## 2023-03-11 NOTE — Progress Notes (Signed)
 E-Visit for Acne   We are sorry that you are experiencing this issue.  Here is how we plan to help!  Based on what you shared with me it looks like you have uncomplicated acne.  Acne is a disorder of the hair follicles and oil glands (sebaceous glands). The sebaceous glands secrete oils to keep the skin moist.  When the glands get clogged, it can lead to pimples or cysts.  These cysts may become infected and leave scars. Acne is very common and normally occurs at puberty.  Acne is also inherited.  Your personal care plan consists of the following recommendations:  I recommend that you use a daily cleanser  You might try an over the counter cleanser that has benzoyl peroxide.  I recommend that you start with a product that has 2.5% benzoyl peroxide.  Stronger concentrations have not been shown to be more effective.  I have prescribed a topical gel with an antibiotic:  Clindamycin  1% lotion.  Apply the lotion to the affected skin twice daily.  Be sure to read the package insert to understand potential side effects,  If excessive dryness or peeling occurs, reduce dose frequency or concentration of the topical scrubs.  If excessive stinging or burning occurs, remove the topical gel with mild soap and water and resume at a lower dose the next day.  Remember oral antibiotics and topical acne treatments may increase your sensitivity to the sun!  HOME CARE: Do not squeeze pimples because that can often lead to infections, worse acne, and scars. Use a moisturizer that contains retinoid or fruit acids that may inhibit the development of new acne lesions. Although there is not a clear link that foods can cause acne, doctors do believe that too many sweets predispose you to skin problems.  GET HELP RIGHT AWAY IF: If your acne gets worse or is not better within 10 days. If you become depressed. If you become pregnant, discontinue medications and call your OB/GYN.  MAKE SURE YOU: Understand these  instructions. Will watch your condition. Will get help right away if you are not doing well or get worse.  Thank you for choosing an e-visit.  Your e-visit answers were reviewed by a board certified advanced clinical practitioner to complete your personal care plan. Depending upon the condition, your plan could have included both over the counter or prescription medications.  Please review your pharmacy choice. Make sure the pharmacy is open so you can pick up prescription now. If there is a problem, you may contact your provider through [employer] and have the prescription routed to another pharmacy.  Your safety is important to us . If you have drug allergies check your prescription carefully.   For the next 24 hours you can use MyChart to ask questions about today's visit, request a non-urgent call back, or ask for a work or school excuse. You will get an email in the next two days asking about your experience. I hope that your e-visit has been valuable and will speed your recovery. I have spent 5 minutes in review of e-visit questionnaire, review and updating patient chart, medical decision making and response to patient.   Kirk RAMAN Mayers, PA-C

## 2023-04-17 ENCOUNTER — Telehealth: Admitting: Family Medicine

## 2023-04-17 ENCOUNTER — Telehealth: Payer: Self-pay | Admitting: Family Medicine

## 2023-04-17 DIAGNOSIS — J029 Acute pharyngitis, unspecified: Secondary | ICD-10-CM

## 2023-04-17 DIAGNOSIS — R509 Fever, unspecified: Secondary | ICD-10-CM

## 2023-04-17 NOTE — Progress Notes (Signed)
 E-Visit for Sore Throat  We are sorry that you are not feeling well.  Here is how we plan to help!  Your symptoms indicate a likely viral infection (Pharyngitis).   Pharyngitis is inflammation in the back of the throat which can cause a sore throat, scratchiness and sometimes difficulty swallowing.   Pharyngitis is typically caused by a respiratory virus and will just run its course.  Please keep in mind that your symptoms could last up to 10 days.  For throat pain, we recommend over the counter oral pain relief medications such as acetaminophen or aspirin, or anti-inflammatory medications such as ibuprofen or naproxen sodium.  Topical treatments such as oral throat lozenges or sprays may be used as needed.  Avoid close contact with loved ones, especially the very young and elderly.  Remember to wash your hands thoroughly throughout the day as this is the number one way to prevent the spread of infection and wipe down door knobs and counters with disinfectant.  After careful review of your answers, I would not recommend an antibiotic for your condition.  Antibiotics should not be used to treat conditions that we suspect are caused by viruses like the virus that causes the common cold or flu. However, some people can have Strep with atypical symptoms. You may need formal testing in clinic or office to confirm if your symptoms continue or worsen.  Providers prescribe antibiotics to treat infections caused by bacteria. Antibiotics are very powerful in treating bacterial infections when they are used properly.  To maintain their effectiveness, they should be used only when necessary.  Overuse of antibiotics has resulted in the development of super bugs that are resistant to treatment!    Home Care: Only take medications as instructed by your medical team. Do not drink alcohol while taking these medications. A steam or ultrasonic humidifier can help congestion.  You can place a towel over your head and  breathe in the steam from hot water coming from a faucet. Avoid close contacts especially the very young and the elderly. Cover your mouth when you cough or sneeze. Always remember to wash your hands.  Get Help Right Away If: You develop worsening fever or throat pain. You develop a severe head ache or visual changes. Your symptoms persist after you have completed your treatment plan.  Make sure you Understand these instructions. Will watch your condition. Will get help right away if you are not doing well or get worse.   Thank you for choosing an e-visit.  Your e-visit answers were reviewed by a board certified advanced clinical practitioner to complete your personal care plan. Depending upon the condition, your plan could have included both over the counter or prescription medications.  Please review your pharmacy choice. Make sure the pharmacy is open so you can pick up prescription now. If there is a problem, you may contact your provider through [employer] and have the prescription routed to another pharmacy.  Your safety is important to [place]. If you have drug allergies check your prescription carefully.   For the next 24 hours you can use MyChart to ask questions about today's visit, request a non-urgent call back, or ask for a work or school excuse. You will get an email in the next two days asking about your experience. I hope that your e-visit has been valuable and will speed your recovery.    have provided 5 minutes of non face to face time during this encounter for chart review and documentation.

## 2023-04-17 NOTE — Progress Notes (Signed)
  Because of your symptoms and the need for testing, I feel your condition warrants further evaluation and I recommend that you be seen in a face-to-face visit.   NOTE: There will be NO CHARGE for this E-Visit   If you are having a true medical emergency, please call 911.     For an urgent face to face visit, [place] has multiple urgent care centers for your convenience.  Click the link below for the full list of locations and hours, walk-in wait times, appointment scheduling options and driving directions:  Urgent Care - [place], [place], [place], [place], [place], [place]  [place]     Your MyChart E-visit questionnaire answers were reviewed by a board certified advanced clinical practitioner to complete your personal care plan based on your specific symptoms.    Thank you for using e-Visits.

## 2023-05-23 ENCOUNTER — Telehealth: Admitting: Physician Assistant

## 2023-05-23 DIAGNOSIS — Z3200 Encounter for pregnancy test, result unknown: Secondary | ICD-10-CM

## 2023-05-23 DIAGNOSIS — R112 Nausea with vomiting, unspecified: Secondary | ICD-10-CM

## 2023-05-24 NOTE — Progress Notes (Signed)
  Because of possible pregnancy that needs to be assessed, I feel your condition warrants further evaluation and I recommend that you be seen in a face-to-face visit.   NOTE: There will be NO CHARGE for this E-Visit   If you are having a true medical emergency, please call 911.     For an urgent face to face visit, [place] has multiple urgent care centers for your convenience.  Click the link below for the full list of locations and hours, walk-in wait times, appointment scheduling options and driving directions:  Urgent Care - [place], [place], [place], [place], [place], [place]  [place]     Your MyChart E-visit questionnaire answers were reviewed by a board certified advanced clinical practitioner to complete your personal care plan based on your specific symptoms.    Thank you for using e-Visits.

## 2023-07-24 ENCOUNTER — Telehealth: Payer: Self-pay | Admitting: Family Medicine

## 2023-07-24 DIAGNOSIS — K047 Periapical abscess without sinus: Secondary | ICD-10-CM

## 2023-07-24 MED ORDER — CLINDAMYCIN HCL 300 MG PO CAPS: 300.0000 mg | ORAL_CAPSULE | Freq: Three times a day (TID) | ORAL | 0 refills | Status: AC

## 2023-07-24 NOTE — Progress Notes (Signed)

## 2023-07-24 NOTE — Progress Notes (Signed)
 Message sent to patient requesting further input regarding current symptoms. Awaiting patient response.

## 2023-08-17 ENCOUNTER — Telehealth: Admitting: Physician Assistant

## 2023-08-17 DIAGNOSIS — K047 Periapical abscess without sinus: Secondary | ICD-10-CM

## 2023-08-17 NOTE — Progress Notes (Signed)
  Because you have recently failed a first-line treatment, I feel your condition warrants further evaluation and I recommend that you be seen in a face-to-face visit.   NOTE: There will be NO CHARGE for this E-Visit   If you are having a true medical emergency, please call 911.     For an urgent face to face visit, [facility] has multiple urgent care centers for your convenience.  Click the link below for the full list of locations and hours, walk-in wait times, appointment scheduling options and driving directions:  Urgent Care - [place], [place], [place], [place], [place], [place]       Your MyChart E-visit questionnaire answers were reviewed by a board certified advanced clinical practitioner to complete your personal care plan based on your specific symptoms.    Thank you for using e-Visits.     I have spent 5 minutes in review of e-visit questionnaire, review and updating patient chart, medical decision making and response to patient.   Delon CHRISTELLA Dickinson, PA-C

## 2023-08-22 ENCOUNTER — Telehealth: Payer: Self-pay | Admitting: Physician Assistant

## 2023-08-22 DIAGNOSIS — M5442 Lumbago with sciatica, left side: Secondary | ICD-10-CM

## 2023-08-22 MED ORDER — CYCLOBENZAPRINE HCL 10 MG PO TABS
5.0000 mg | ORAL_TABLET | Freq: Three times a day (TID) | ORAL | 0 refills | Status: AC | PRN
Start: 2023-08-22 — End: ?

## 2023-08-22 MED ORDER — NAPROXEN 500 MG PO TABS: 500.0000 mg | ORAL_TABLET | Freq: Two times a day (BID) | ORAL | 0 refills | Status: DC

## 2023-08-22 NOTE — Progress Notes (Signed)

## 2023-12-20 ENCOUNTER — Telehealth: Admitting: Family Medicine

## 2023-12-20 DIAGNOSIS — K047 Periapical abscess without sinus: Secondary | ICD-10-CM | POA: Diagnosis not present

## 2023-12-20 MED ORDER — NAPROXEN 500 MG PO TABS: 500.0000 mg | ORAL_TABLET | Freq: Two times a day (BID) | ORAL | 0 refills | Status: AC

## 2023-12-20 MED ORDER — CLINDAMYCIN HCL 300 MG PO CAPS: 300.0000 mg | ORAL_CAPSULE | Freq: Three times a day (TID) | ORAL | 0 refills | Status: AC

## 2023-12-20 NOTE — Progress Notes (Signed)
 E-Visit for Dental Pain  We are sorry that you are not feeling well.  Here is how we plan to help!  Based on what you have shared with me in the questionnaire, it sounds like you have dental infection  Clindamycin  300mg  3 times a day for 7 days and Naprosyn  500mg  2 times a day for 7 days for discomfort  It is imperative that you see a dentist within 10 days of this eVisit to determine the cause of the dental pain and be sure it is adequately treated  A toothache or tooth pain is caused when the nerve in the root of a tooth or surrounding a tooth is irritated. Dental (tooth) infection, decay, injury, or loss of a tooth are the most common causes of dental pain. Pain may also occur after an extraction (tooth is pulled out). Pain sometimes originates from other areas and radiates to the jaw, thus appearing to be tooth pain.Bacteria growing inside your mouth can contribute to gum disease and dental decay, both of which can cause pain. A toothache occurs from inflammation of the central portion of the tooth called pulp. The pulp contains nerve endings that are very sensitive to pain. Inflammation to the pulp or pulpitis may be caused by dental cavities, trauma, and infection.    HOME CARE:   For toothaches: Over-the-counter pain medications such as acetaminophen  or ibuprofen  may be used. Take these as directed on the package while you arrange for a dental appointment. Avoid very cold or hot foods, because they may make the pain worse. You may get relief from biting on a cotton ball soaked in oil of cloves. You can get oil of cloves at most drug stores.  For jaw pain:  Aspirin may be helpful for problems in the joint of the jaw in adults. If pain happens every time you open your mouth widely, the temporomandibular joint (TMJ) may be the source of the pain. Yawning or taking a large bite of food may worsen the pain. An appointment with your doctor or dentist will help you find the cause.     GET  HELP RIGHT AWAY IF:  You have a high fever or chills If you have had a recent head or face injury and develop headache, light headedness, nausea, vomiting, or other symptoms that concern you after an injury to your face or mouth, you could have a more serious injury in addition to your dental injury. A facial rash associated with a toothache: This condition may improve with medication. Contact your doctor for them to decide what is appropriate. Any jaw pain occurring with chest pain: Although jaw pain is most commonly caused by dental disease, it is sometimes referred pain from other areas. People with heart disease, especially people who have had stents placed, people with diabetes, or those who have had heart surgery may have jaw pain as a symptom of heart attack or angina. If your jaw or tooth pain is associated with lightheadedness, sweating, or shortness of breath, you should see a doctor as soon as possible. Trouble swallowing or excessive pain or bleeding from gums: If you have a history of a weakened immune system, diabetes, or steroid use, you may be more susceptible to infections. Infections can often be more severe and extensive or caused by unusual organisms. Dental and gum infections in people with these conditions may require more aggressive treatment. An abscess may need draining or IV antibiotics, for example.  MAKE SURE YOU   Understand these instructions.  Will watch your condition. Will get help right away if you are not doing well or get worse.  Thank you for choosing an e-visit.  Your e-visit answers were reviewed by a board certified advanced clinical practitioner to complete your personal care plan. Depending upon the condition, your plan could have included both over the counter or prescription medications.  Please review your pharmacy choice. Make sure the pharmacy is open so you can pick up prescription now. If there is a problem, you may contact your provider through  [employer] and have the prescription routed to another pharmacy.  Your safety is important to us . If you have drug allergies check your prescription carefully.   For the next 24 hours you can use MyChart to ask questions about today's visit, request a non-urgent call back, or ask for a work or school excuse. You will get an email in the next two days asking about your experience. I hope that your e-visit has been valuable and will speed your recovery.  I have spent 5 minutes in review of e-visit questionnaire, review and updating patient chart, medical decision making and response to patient.   Chiquita CHRISTELLA Barefoot, NP

## 2024-03-05 ENCOUNTER — Telehealth: Admitting: Family Medicine

## 2024-03-05 DIAGNOSIS — K047 Periapical abscess without sinus: Secondary | ICD-10-CM

## 2024-03-05 NOTE — Progress Notes (Signed)
" °  Because you were just treated at end of Nov, you will need to follow up in person for dental infection and pain. We feel your condition warrants further evaluation and recommend that you be seen in a face-to-face visit at local urgent care or PCP office.   NOTE: There will be NO CHARGE for this E-Visit   If you are having a true medical emergency, please call 911.   "
